# Patient Record
Sex: Female | Born: 1970 | Race: White | Smoking: Current every day smoker
Health system: Northeastern US, Academic
[De-identification: ages and names within clinical notes are randomized; demographics above are authoritative.]

## PROBLEM LIST (undated history)

## (undated) DIAGNOSIS — G709 Myoneural disorder, unspecified: Secondary | ICD-10-CM

## (undated) DIAGNOSIS — R5382 Chronic fatigue, unspecified: Secondary | ICD-10-CM

## (undated) DIAGNOSIS — F329 Major depressive disorder, single episode, unspecified: Secondary | ICD-10-CM

## (undated) DIAGNOSIS — K5190 Ulcerative colitis, unspecified, without complications: Secondary | ICD-10-CM

## (undated) DIAGNOSIS — G43909 Migraine, unspecified, not intractable, without status migrainosus: Secondary | ICD-10-CM

## (undated) DIAGNOSIS — R269 Unspecified abnormalities of gait and mobility: Secondary | ICD-10-CM

## (undated) DIAGNOSIS — G35 Multiple sclerosis: Secondary | ICD-10-CM

## (undated) DIAGNOSIS — G248 Other dystonia: Secondary | ICD-10-CM

## (undated) DIAGNOSIS — N200 Calculus of kidney: Secondary | ICD-10-CM

## (undated) DIAGNOSIS — M797 Fibromyalgia: Secondary | ICD-10-CM

## (undated) DIAGNOSIS — M1990 Unspecified osteoarthritis, unspecified site: Secondary | ICD-10-CM

## (undated) DIAGNOSIS — K219 Gastro-esophageal reflux disease without esophagitis: Secondary | ICD-10-CM

## (undated) DIAGNOSIS — F419 Anxiety disorder, unspecified: Secondary | ICD-10-CM

## (undated) DIAGNOSIS — F32A Depression, unspecified: Secondary | ICD-10-CM

## (undated) DIAGNOSIS — K5090 Crohn's disease, unspecified, without complications: Secondary | ICD-10-CM

## (undated) DIAGNOSIS — R519 Headache, unspecified: Secondary | ICD-10-CM

## (undated) HISTORY — DX: Depression, unspecified: F32.A

## (undated) HISTORY — DX: Migraine, unspecified, not intractable, without status migrainosus: G43.909

## (undated) HISTORY — DX: Fibromyalgia: M79.7

## (undated) HISTORY — DX: Chronic fatigue, unspecified: R53.82

## (undated) HISTORY — DX: Myoneural disorder, unspecified: G70.9

## (undated) HISTORY — PX: HERNIA REPAIR: SHX51

## (undated) HISTORY — DX: Anxiety disorder, unspecified: F41.9

## (undated) HISTORY — DX: Unspecified abnormalities of gait and mobility: R26.9

## (undated) HISTORY — DX: Major depressive disorder, single episode, unspecified: F32.9

## (undated) HISTORY — PX: CHOLECYSTECTOMY: SHX55

## (undated) HISTORY — DX: Unspecified osteoarthritis, unspecified site: M19.90

## (undated) HISTORY — DX: Other dystonia: G24.8

## (undated) HISTORY — DX: Multiple sclerosis: G35

## (undated) HISTORY — DX: Ulcerative colitis, unspecified, without complications: K51.90

## (undated) HISTORY — DX: Gastro-esophageal reflux disease without esophagitis: K21.9

## (undated) HISTORY — DX: Calculus of kidney: N20.0

## (undated) HISTORY — PX: ABDOMEN SURGERY: SHX537

## (undated) HISTORY — PX: OTHER SURGICAL HISTORY: SHX169

## (undated) HISTORY — PX: SUBTOTAL COLECTOMY: SHX855

## (undated) HISTORY — PX: BUNIONECTOMY: SHX129

## (undated) HISTORY — DX: Crohn's disease, unspecified, without complications: K50.90

## (undated) HISTORY — PX: COLONOSCOPY: SHX174

## (undated) HISTORY — PX: COLON SURGERY: SHX602

## (undated) HISTORY — PX: APPENDECTOMY: SHX54

## (undated) HISTORY — PX: BACK SURGERY: SHX140

## (undated) HISTORY — PX: UMBILICAL HERNIA REPAIR: SHX196

---
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
## 1991-12-24 HISTORY — PX: BACK SURGERY: SHX140

## 1995-12-24 HISTORY — PX: BUNIONECTOMY: SHX129

## 2005-08-15 ENCOUNTER — Other Ambulatory Visit: Payer: Self-pay | Admitting: Cardiology

## 2005-11-04 ENCOUNTER — Other Ambulatory Visit: Payer: Self-pay | Admitting: Cardiology

## 2005-12-23 HISTORY — PX: SUBTOTAL COLECTOMY: SHX855

## 2006-02-03 ENCOUNTER — Other Ambulatory Visit: Payer: Self-pay | Admitting: Cardiology

## 2006-05-01 ENCOUNTER — Other Ambulatory Visit: Payer: Self-pay | Admitting: Cardiology

## 2006-06-04 DIAGNOSIS — G35 Multiple sclerosis: Secondary | ICD-10-CM | POA: Insufficient documentation

## 2006-08-05 ENCOUNTER — Other Ambulatory Visit: Payer: Self-pay | Admitting: Cardiology

## 2006-11-19 ENCOUNTER — Other Ambulatory Visit: Payer: Self-pay | Admitting: Cardiology

## 2007-05-19 DIAGNOSIS — G35 Multiple sclerosis: Secondary | ICD-10-CM | POA: Insufficient documentation

## 2008-02-01 DIAGNOSIS — N200 Calculus of kidney: Secondary | ICD-10-CM | POA: Insufficient documentation

## 2008-02-01 DIAGNOSIS — K5190 Ulcerative colitis, unspecified, without complications: Secondary | ICD-10-CM | POA: Insufficient documentation

## 2008-02-01 DIAGNOSIS — K5090 Crohn's disease, unspecified, without complications: Secondary | ICD-10-CM | POA: Insufficient documentation

## 2008-02-01 DIAGNOSIS — F17200 Nicotine dependence, unspecified, uncomplicated: Secondary | ICD-10-CM | POA: Insufficient documentation

## 2008-02-01 DIAGNOSIS — G43909 Migraine, unspecified, not intractable, without status migrainosus: Secondary | ICD-10-CM | POA: Insufficient documentation

## 2008-02-08 DIAGNOSIS — IMO0001 Reserved for inherently not codable concepts without codable children: Secondary | ICD-10-CM | POA: Insufficient documentation

## 2008-02-08 DIAGNOSIS — G35 Multiple sclerosis: Secondary | ICD-10-CM | POA: Insufficient documentation

## 2008-10-10 ENCOUNTER — Encounter: Payer: Self-pay | Admitting: General Practice

## 2008-10-11 ENCOUNTER — Encounter: Payer: Self-pay | Admitting: General Practice

## 2010-12-23 HISTORY — PX: LUMBAR FUSION: SHX111

## 2011-06-12 ENCOUNTER — Other Ambulatory Visit: Payer: Self-pay | Admitting: Internal Medicine

## 2011-07-09 ENCOUNTER — Ambulatory Visit: Payer: Self-pay | Admitting: Nurse Practitioner

## 2011-07-09 ENCOUNTER — Encounter: Payer: Self-pay | Admitting: Nurse Practitioner

## 2011-07-09 VITALS — BP 133/85 | HR 93 | Ht 63.5 in | Wt 184.0 lb

## 2011-07-09 DIAGNOSIS — G35 Multiple sclerosis: Secondary | ICD-10-CM

## 2011-07-09 MED ORDER — MIRTAZAPINE 30 MG PO TABS *I*
30.0000 mg | ORAL_TABLET | Freq: Every evening | ORAL | Status: DC
Start: 2011-07-09 — End: 2012-02-17

## 2011-07-09 NOTE — Progress Notes (Signed)
PATIENT: Grace [name], Grace [name]  MRN: [id number]   DOB: 1971/12/11   DATE OF SERVICE: 07/09/2011          REASON FOR VISIT  Follow up six month appointment to discuss medical management and current symptoms.  HPI  Patient feels she is gradually getting worse and has not taken an DMT to treat her multiple sclerosis symptoms in several years. She is hesitant to resume Copaxone because of injection site reactions and lipoatrophy. She is hoping for a safe oral medication to be available soon. She feels her legs are weaker and she has more spasiticity along with short term memory issues and fatigue.  ALLERGIES  Allergies   Allergen Reactions   . Amoxicillin    . Codeine      Created by Conversion - 0;    . Hydromorphone      Created by Conversion - 0;    . Latex      Created by Conversion - 0;    . Prednisone    . Vicodin (Hydrocodone-Acetaminophen)         CURRENT MEDS:   Current Outpatient Prescriptions   Medication Sig   . mirtazapine (REMERON) 30 MG tablet Take 1 tablet (30 mg total) by mouth nightly     . sertraline (ZOLOFT) 100 MG tablet TAKE 1 TABLET 3 TIMES DAILY   . methylPREDNISolone sodium succinate (SOLU-MEDROL) 125 mg/ml injection INFUSE 1000mg  IN OF NORMAL SALINE EVERY DAYFOR 3 DAYS   . heparin 100 units/ml infusion Heparin 100 unit/ml PFSuse as directed   . sodium chloride 0.9% flush (NORMAL SALINE FLUSH) 0.9 % injection Normal Saline 3ml PFSUse as directed   . milnacipran (SAVELLA) 50 MG tablet take twice per day.   . sertraline (ZOLOFT) 100 MG tablet TAKE 2 TABLETS DAILY.        PERSONAL Hx:  Relapsing remitting multiple sclerosis with ongoing symptoms and further worsening of her short term memory, fatigue and function of her lower legs    ROS:  A 10 point review of systems was done including those positive findings included in the HPI. Patient reports ongoing symptoms and worsening of her short term memory, fatigue and function in her lower legs.    VITAL SIGNS:  Wt Readings from Last 3 Encounters:    07/09/11 83.462 kg (184 lb)   06/22/08 66.588 kg (146 lb 12.8 oz)   05/18/08 66.801 kg (147 lb 4.3 oz)     Temp Readings from Last 3 Encounters:   06/22/08 36.7 C (98.1 F)    05/18/08 36.7 C (98.1 F)    03/25/08 36.6 C (97.8 F)      BP Readings from Last 3 Encounters:   07/09/11 133/85   06/22/08 125/74   05/18/08 128/71     Pulse Readings from Last 3 Encounters:   07/09/11 93   06/22/08 99   05/18/08 98      Pain    07/09/11 1323   PainSc:   4        PHYSICAL EXAM:  On physical examination she was alert and oriented times three. There are choppy occular pursuits with normal facial movement and normal finger to nose ataxia. Upper strength is 5 out of 5. Lower strength is 4 out of 5 with dorsi flexion 4 out of 5. There is increase in tone in both legs and decrease in vibratory sensation in both feet. A timed 8 meter walk took 4:99 seconds.    ASSESSMENT:  Relapsing remitting multiple sclerosis  with ongoing symptoms but more short term memory issues, fatigue and weaker lower legs. I suggested she try physical therapy to strengthen her lower legs and to try to help with coordination. I also suggested she be scheduled for another MRI of her Brain and cervical spine. Patient will let us know when she can schedule an MRI working around her joint custody of her children and let our office times that would work for her. She is hoping that a safe oral medication to treat her MS will be available soon as she does not want to resume Copaxone. I made no other changes to her medications. She has a follow up appointment in three months. Thank you for allowing [place] to participate in the care of your patient.

## 2011-07-24 ENCOUNTER — Telehealth: Payer: Self-pay | Admitting: Nurse Practitioner

## 2011-07-24 NOTE — Telephone Encounter (Signed)
Pt called looking to set up her brain and cspine MRI since she had a severe panic attack on 06/25/11. She mentioned she has a Diazepam rx and would like these rescheduled. Since she wasn't able to complete the exams can you place new orders so I can schedule at either [facility] or [facility]? Thanks!

## 2011-07-29 ENCOUNTER — Other Ambulatory Visit: Payer: Self-pay | Admitting: Internal Medicine

## 2011-07-29 NOTE — Progress Notes (Signed)
Pt is scheduled @ [facility] for 3T brain MRI and cspine on 08/01/11 @ 12:45pm. Pt confirmed on 07/29/11. Cspine Berkley Harvey is [phone] EXP 08/01/11. Brain Berkley Harvey is [phone] EXP 09/12/11. Info also mailed on 07/29/11.

## 2011-08-05 ENCOUNTER — Telehealth: Payer: Self-pay | Admitting: Medical

## 2011-08-05 NOTE — Telephone Encounter (Signed)
 Please call with MRI results.

## 2011-08-05 NOTE — Telephone Encounter (Signed)
Spoke to pt, results given, scan shows activity.  Grace [name]

## 2011-08-06 NOTE — Telephone Encounter (Signed)
Pt was wondering if you can call her back @ [phone] to discuss the results again. She was driving last night and couldn't really talk. Please call to discuss. Thanks.

## 2011-08-06 NOTE — Telephone Encounter (Signed)
Called, no answer, left message.  Tom

## 2011-08-07 NOTE — Telephone Encounter (Signed)
The MRI scans have been completed.  Grace [name]

## 2011-08-07 NOTE — Telephone Encounter (Signed)
Spoke to pt again today, answered her questions.  Grace [name]

## 2011-08-07 NOTE — Telephone Encounter (Signed)
Please try pt at new number

## 2011-12-27 ENCOUNTER — Ambulatory Visit: Payer: Self-pay | Admitting: Nurse Practitioner

## 2011-12-27 ENCOUNTER — Encounter: Payer: Self-pay | Admitting: Nurse Practitioner

## 2011-12-27 MED ORDER — DIAZEPAM 5 MG PO TABS *I*
5.0000 mg | ORAL_TABLET | Freq: Two times a day (BID) | ORAL | Status: DC | PRN
Start: 2011-12-27 — End: 2013-04-06

## 2012-02-17 ENCOUNTER — Other Ambulatory Visit: Payer: Self-pay | Admitting: Nurse Practitioner

## 2012-02-17 MED ORDER — MIRTAZAPINE 30 MG PO TABS *I*
30.0000 mg | ORAL_TABLET | Freq: Every evening | ORAL | Status: DC
Start: 2012-02-17 — End: 2013-04-06

## 2013-04-06 ENCOUNTER — Emergency Department
Admission: EM | Admit: 2013-04-06 | Disposition: A | Discharge status: Home / Self Care | Payer: Self-pay | Source: Ambulatory Visit | Attending: Emergency Medicine | Admitting: Emergency Medicine

## 2013-04-06 ENCOUNTER — Other Ambulatory Visit: Payer: Self-pay | Admitting: Emergency Medicine

## 2013-04-06 ENCOUNTER — Encounter: Payer: Self-pay | Admitting: Emergency Medicine

## 2013-04-06 ENCOUNTER — Other Ambulatory Visit: Payer: Self-pay | Admitting: Gastroenterology

## 2013-04-06 LAB — BASIC METABOLIC PANEL
Anion Gap: 13 (ref 7–16)
Anion Gap: 10 (ref 7–16)
CO2: 17 mmol/L — ABNORMAL LOW (ref 20–28)
CO2: 25 mmol/L (ref 20–28)
Calcium: 9.3 mg/dL (ref 8.8–10.2)
Calcium: 8.7 mg/dL — ABNORMAL LOW (ref 8.8–10.2)
Chloride: 106 mmol/L (ref 96–108)
Chloride: 102 mmol/L (ref 96–108)
Creatinine: 0.89 mg/dL (ref 0.51–0.95)
Creatinine: 0.9 mg/dL (ref 0.51–0.95)
GFR,Black: 92 *
GFR,Black: 90 *
GFR,Caucasian: 80 *
GFR,Caucasian: 78 *
Glucose: 85 mg/dL (ref 60–99)
Glucose: 117 mg/dL — ABNORMAL HIGH (ref 60–99)
Lab: 10 mg/dL (ref 6–20)
Lab: 10 mg/dL (ref 6–20)
Potassium: 4.2 mmol/L (ref 3.3–5.1)
Potassium: 3.7 mmol/L (ref 3.3–5.1)
Sodium: 136 mmol/L (ref 133–145)
Sodium: 137 mmol/L (ref 133–145)

## 2013-04-06 LAB — RUQ PANEL (ED ONLY)
ALT: 18 U/L (ref 0–35)
ALT: 17 U/L (ref 0–35)
AST: 17 U/L (ref 0–35)
Albumin: 4.7 g/dL (ref 3.5–5.2)
Albumin: 4.2 g/dL (ref 3.5–5.2)
Alk Phos: 89 U/L (ref 35–105)
Alk Phos: 81 U/L (ref 35–105)
Amylase: 24 U/L — ABNORMAL LOW (ref 28–100)
Amylase: 24 U/L — ABNORMAL LOW (ref 28–100)
Bilirubin,Direct: <0.2 mg/dL (ref 0.0–0.3)
Bilirubin,Direct: <0.2 mg/dL (ref 0.0–0.3)
Bilirubin,Total: 0.8 mg/dL (ref 0.0–1.2)
Bilirubin,Total: 0.7 mg/dL (ref 0.0–1.2)
Lipase: 31 U/L (ref 13–60)
Lipase: 36 U/L (ref 13–60)
Total Protein: 7.3 g/dL (ref 6.3–7.7)
Total Protein: 6.6 g/dL (ref 6.3–7.7)

## 2013-04-06 LAB — CBC AND DIFFERENTIAL
Baso # K/uL: 0 10*3/uL (ref 0.0–0.1)
Basophil %: 0.2 % (ref 0.1–1.2)
Eos # K/uL: 0.4 10*3/uL (ref 0.0–0.4)
Eosinophil %: 3.6 % (ref 0.7–5.8)
Hematocrit: 45 % (ref 34–45)
Hemoglobin: 15.2 g/dL (ref 11.2–15.7)
Lymph # K/uL: 2.1 10*3/uL (ref 1.2–3.7)
Lymphocyte %: 20 % (ref 19.3–51.7)
MCV: 93 fL (ref 79–95)
Mono # K/uL: 0.6 10*3/uL (ref 0.2–0.9)
Monocyte %: 5.1 % (ref 4.7–12.5)
Neut # K/uL: 7.6 10*3/uL — ABNORMAL HIGH (ref 1.6–6.1)
Platelets: 269 10*3/uL (ref 160–370)
RBC: 4.9 MIL/uL (ref 3.9–5.2)
RDW: 12.7 % (ref 11.7–14.4)
Seg Neut %: 71.1 % (ref 34.0–71.1)
WBC: 10.7 10*3/uL — ABNORMAL HIGH (ref 4.0–10.0)

## 2013-04-06 LAB — PREGNANCY TEST, SERUM
Preg,Serum: NEGATIVE
Preg,Serum: NEGATIVE

## 2013-04-06 LAB — HOLD GREEN WITH GEL

## 2013-04-06 LAB — HOLD BLUE

## 2013-04-06 LAB — CRP: CRP: 7 mg/L (ref 0–10)

## 2013-04-06 LAB — HM HIV SCREENING OFFERED

## 2013-04-06 LAB — HEMOLYZED TEST

## 2013-04-06 LAB — SEDIMENTATION RATE, AUTOMATED: Sedimentation Rate: 5 mm/hr (ref 0–20)

## 2013-04-06 MED ORDER — IBUPROFEN 400 MG PO TABS *I*
800.0000 mg | ORAL_TABLET | Freq: Once | ORAL | Status: AC
Start: 2013-04-06 — End: 2013-04-06
  Administered 2013-04-06: 800 mg via ORAL
  Filled 2013-04-06: qty 2

## 2013-04-06 NOTE — ED Notes (Signed)
ED RN INTERN ATTESTATION       I Elise Benne, RN (RN) reviewed the following charting information by the RN intern:  Marily Memos    Nursing Assessments  Medications  Plan of Care  Teaching   Notes    In the chart of Grace [name] (42 y.o. female) and attest to the charting being accurate.

## 2013-04-06 NOTE — ED Notes (Signed)
Assumed care. Pt resting in bed. No new complaints. Will continue to monitor.

## 2013-04-06 NOTE — ED Provider Notes (Addendum)
History     Chief Complaint   Patient presents with   . Abdominal Pain   . Chest Pain     for few days     HPI Comments: 42 yo F h/o multiple sclerosis, Crohn's disease s/p subtotal colectomy here with 6 weeks progressively worsening pelvic pain, abnormal vaginal bleeding. Notes increased discomfort with her periods, heavier bleeding with her last 2 cycles, and now spotting every 2-3 days between cycles. Also notes increasing abdominal fullness, bladder pressure, and cramping in her left thigh. The last 2-3 days felt a "ripping" sensation when she would lie down on her left side, which was new and concerning, causing her to seek evaluation. No fevers, dysuria, changes in bowels. Supposed to get outpatient ultrasound through OB.GYN office but was not done due to insurance issues.    Also notes chest heaviness past year, with generalized fatigue, unchanged today.      History provided by:  Patient      Past Medical History   Diagnosis Date   . Multiple sclerosis    . Crohn's disease             Past Surgical History   Procedure Laterality Date   . Subtotal colectomy     . Umbilical hernia repair     . Cesarian         No family history on file.      Social History      reports that she has been passively smoking Cigarettes.  She has been smoking about 0.20 packs per day. She does not have any smokeless tobacco history on file. Her alcohol, drug, and sexual activity histories are not on file.    Living Situation    Questions Responses    Patient lives with     Homeless     Caregiver for other family member     External Services     Employment     Domestic Violence Risk           Review of Systems   Review of Systems   Constitutional: Negative for fever.   HENT: Negative for neck pain.    Eyes: Negative for pain.   Respiratory: Negative for shortness of breath.    Cardiovascular: Positive for chest pain.   Gastrointestinal: Positive for abdominal pain.   Genitourinary: Positive for vaginal bleeding and pelvic pain.  Negative for flank pain.   Musculoskeletal: Positive for back pain.   Skin: Negative for rash.   Neurological: Negative for headaches.       Physical Exam     ED Triage Vitals   BP Heart Rate Heart Rate(via Pulse Ox) Resp Temp Temp Source SpO2 O2 Device O2 Flow Rate   04/06/13 1038 04/06/13 1038 -- 04/06/13 1038 04/06/13 1038 04/06/13 1038 04/06/13 1038 04/06/13 1038 --   112/67 mmHg 77  16 36.6 C (97.9 F) TEMPORAL 100 % None (Room air)       Weight           04/06/13 1038           74.844 kg (165 lb)               Physical Exam   Nursing note and vitals reviewed.  Constitutional: She is oriented to person, place, and time. She appears well-developed. No distress.   Non-toxic   HENT:   Head: Normocephalic and atraumatic.   Eyes: EOM are normal.   Neck: Normal range of motion. Neck supple.  Cardiovascular: Normal rate and regular rhythm.    Pulmonary/Chest: Effort normal. No respiratory distress.   Abdominal: Soft. She exhibits no distension. There is tenderness (tender LLQ>suprapubically/RLQ to deep palpation, no rebound, guarding or percussion tederness).   Genitourinary:   Pelvic - No masses or no vaginal bleeding noted. Scant amount of whitish discharge. Some discomfort with cervical motion, minimal. No significant adnexal tenderness or fullness appreciated.    Musculoskeletal: Normal range of motion. She exhibits no edema.   Neurological: She is alert and oriented to person, place, and time.   Skin: Skin is warm and dry. No rash noted.   Psychiatric: She has a normal mood and affect.       Medical Decision Making      Amount and/or Complexity of Data Reviewed  Clinical lab tests: ordered and reviewed  Tests in the radiology section of CPT: ordered and reviewed  Tests in the medicine section of CPT: ordered and reviewed        Initial Evaluation:  ED First Provider Contact    Date/Time Event User Comments    04/06/13 1034 ED Provider First Contact [place], [facility] Initial Face to Face Provider Contact           Patient seen by me on arrival date of 04/06/2013 at 1318    Assessment:  42 y.o., female comes to the ED with 6 weeks progressive pelvic pain, fullness and irregular vaginal bleeding, overall non-toxic with some abdominal tenderness but no peritoneal signs. HD normal. Pelvic unremarkable. Patient mentioned to initial provider that she was experiencing chest heaviness, but on further history, this has been ongoing and unchanged.     Differential Diagnosis includes pelvic mass (fibroid, endometrial/ovarian malignancy), uti/pyelo, exam not suggestive of PID, r/o pregnancy, unlikely crohn's flare, appy, dissection             Plan:   IV, tele, 02 prn  Labs: cbc, ed7, ruq, pelvic cultures, ua, ucx, upreg  Imaging: cxr, ecg, CT abd/pelvis, pelvic US  Treatment: ivf, anti-emetics and pain meds prn     Dispo depends on clinical course, symptom control, and workup. Consult as appropriate, anticipate home if all benign.          Gaspar Cola, MD    Gaspar Cola, MD  Resident  04/06/13 [phone]    Resident Attestation:     Patient seen by me on arrival date of 04/06/2013 at 1430    History:   I reviewed this patient, reviewed the resident's note and agree.  Exam:   I examined this patient, reviewed the resident's note and agree.    Decision Making:   I discussed with the resident his/her documented decision making  and agree.      Author Loa Socks, MD      Loa Socks, MD  04/07/13 [phone]

## 2013-04-06 NOTE — ED Notes (Signed)
Abdominal pain lower abdomen. History of cysts.. Also complains of chest pressure. No fever, chills.

## 2013-04-06 NOTE — First Provider Contact (Signed)
ED Medical Screening Exam Note    Initial provider evaluation performed by   ED First Provider Contact    Date/Time Event User Comments    04/06/13 1034 ED Provider First Contact Maeli Spacek, [facility] Initial Face to Face Provider Contact        Patient with PMH multiple sclerosis presents with several days of abdominal pain, chest heaviness, nausea.    NAD in triage. VSS.    EKG and LABS and CXR ordered.    Patient requires further evaluation.     Vonzell Schlatter Riggins, [place], 04/06/2013, 10:34 AM

## 2013-04-06 NOTE — ED Notes (Signed)
Pt asking about imaging result and was informed that the CT results has not yet returned. Pt complaining of a 6/10 pain and requesting pain medication, provider notified. Sister at beside.

## 2013-04-06 NOTE — ED Notes (Signed)
Pt to CT

## 2013-04-06 NOTE — ED Notes (Signed)
A and O x's 4.  VSS.  Pt here with lower abd pain,  Feels like cramping. H/O cysts.  LMP was 2 weeks ago,  Spotting since.  Will continue to monitor per MD orders.

## 2013-04-08 LAB — EKG 12-LEAD
P: 49 degrees
QRS: 91 degrees
Rate: 70 {beats}/min
Statement: BORDERLINE
T: 46 degrees

## 2015-02-27 ENCOUNTER — Ambulatory Visit
Admit: 2015-02-27 | Discharge: 2015-02-27 | Disposition: A | Discharge status: Home / Self Care | Payer: Self-pay | Attending: Emergency Medicine | Admitting: Emergency Medicine

## 2015-02-27 ENCOUNTER — Other Ambulatory Visit: Payer: Self-pay | Admitting: Gastroenterology

## 2015-02-27 DIAGNOSIS — R0789 Other chest pain: Secondary | ICD-10-CM

## 2015-02-27 HISTORY — DX: Fibromyalgia: M79.7

## 2015-02-27 LAB — HM HIV SCREENING OFFERED

## 2015-02-27 NOTE — ED Notes (Signed)
chest pain x one week - worse while lying down - pain substernal - left shoulder and back - pain comes in "shocks" and sometimes a flutter feeling

## 2015-02-27 NOTE — Discharge Instructions (Signed)
The most likely cause of your symptoms is probably related to your esophagus as it was 7 years ago.  For that reason, increase the Prilosec to 2 pills (40 mg) daily and a referral was placed to gastroenterologist.    The symptoms are atypical for cardiac disease so you're not sent to the hospital at this time.  Nevertheless a cardiology referral was placed to make sure there is no underlying coronary artery disease.    You should be called in the next 2 days to schedule both of these referrals.    You understand that if you develop worsening chest pain or pressure, fainting, severe shortness of breath or any other worrisome symptoms you should go to emergency room immediately.        If you do not have a primary care doctor, the following physicians are currently accepting new patients:    [facility]  [address]  [phone]  Mayer Camel, MD (FM)  Lyman Speller. Gift, DO (FM)  Rachel Bo, MD (FM)    [facility]  [address]  [phone]  Wyline Mood, MD, PhD (FM)  Esmond Plants, MD [facility] [place])  Accepting Pediatric and Obstetric Patients Only  Carole Civil, MD ([place], [place])    [facility]  [address]  [phone]  Fulton Mole, DO ([place])  Accepting Pediatric Patients Only    [facility]  [address]  [phone]  Alfredia Ferguson, MD (Peds)  Erick Blinks, MD (Peds)     [facility]  [address]  [phone]  Nydia Bouton, MD (MP)  Accepting Pediatric Patients Only                Area    [facility]  [address]  [phone]  Margreta Journey, MD (MP)  Accepting Pediatric Patients Only    [facility]  [address]  [phone]  Accepting Pediatric Patients Only    [facility]  [address] [place]  [phone]  Call for a list of accepting physicians    NEW PRACTICE   [facility]  [address]  [phone]  Dondra Spry, MD (FM)  Lavonne Chick, MD, PhD (FM)  Sindy Guadeloupe, MD (FM)  Jeanmarie Plant, MD [facility])    [facility]  [address]  [phone]  Kenton Kingfisher, MD (FM)  Carmin Richmond, MD (FM)  Dolphus Jenny, MD (FM)  Courtney Heys, DO (FM)    [facility]  [address] [facility]  [facility] Care Facility - 5th floor  [phone]  Call for a list of accepting physicians    [facility]  [address]  [phone]  Valeta Harms, MD (IM/Peds)  Accepting Pediatric Patients Only   [facility]  [place]  [address]  [phone]  Alfonzo Feller, MD (FM)    [facility]  [place]  [address]  [phone]  Alfonzo Feller, MD (FM)    [facility]   [address]   [phone]   Yolanda Manges, MD (IM)   Trula Ore  Gwyndolyn Saxon, MD (IM)     [address]  [phone]  Johny Drilling, MD (FM/OB)  Jennelle Human, MD (FM/OB)  Accepting Pediatric and Obstetric  Patients Only    [facility]  [address]  [phone]  Cindie Crumbly, MD (IM)    [facility]  [address]  [phone]  Rosetta Posner, MD ([place])  Norvel Richards, DO ([place])  Royanne Foots, DO ([place])    [facility]  [address]  [phone]  Didem Miraloglu, MD (IM)     FM: Family Medicine   IM: Internal Medicine  IM/Peds: Internal Medicine & Pediatrics  MP: Family Medicine & Pediatrics  OB Obstetrics  Peds: Pediatrics   For physician referral services, please call [phone] visit [email or web address]

## 2015-02-27 NOTE — UC Provider Note (Signed)
History     Chief Complaint   Patient presents with    Chest Pain     chest pain x one week - worse while lying down - pain substernal - left shoulder and back - pain comes in "shocks" and sometimes a flutter feeling     HPI Comments: 44 year old woman with medical history of multiple sclerosis, Crohn's disease, fibromyalgia, GERD and smoking presents to the urgent care Center with one week of substernal chest pain that is constant and radiates to her left shoulder, left upper back area.  She reports she does feel some dyspnea which is not associated with exertion.  She also feels that there is something stuck in her throat/esophagus.  This feeling and the chest pressure worsens when she eats.  She also reports intermittent "fluttery" sensation in her chest that is worse with meals. She denies nausea or vomiting.  She denies lightheadedness.  She does say that she has diaphoresis at night.  She says that her current symptoms are similar to being hospitalized 7 years ago for esophageal spasms.      She has no history of known coronary artery disease.  She has no known diagnosis of hypertension, hyperlipidemia or diabetes but has not seen a PCP in 2 years for insurance reasons.      Family history: Uncle with heart attack in his 76s.  Grandmother had congestive heart failure.  No coronary artery disease in her parents    The patient does not usually take anti-inflammatory medications because of her Crohn's.  She has taken Motrin a few times in the past week and is not sure if it has helped her symptoms.      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  She has been smoking about 0.25 packs per day. She does not have any smokeless tobacco history on file. Her alcohol, drug, and sexual activity histories are not on file.    Living Situation     Questions Responses    Patient lives with Significant Other    Homeless No    Caregiver for other family member     External Services None    Employment Disabled    Domestic Violence Risk No          Review of Systems   Review of Systems   Constitutional: Positive for diaphoresis. Negative for fever.   HENT: Negative for congestion and sore throat.    Eyes: Negative for visual disturbance.   Respiratory: Positive for chest tightness and shortness  of breath. Negative for cough.    Cardiovascular: Positive for palpitations.   Gastrointestinal: Negative for vomiting and abdominal pain.   Skin: Negative for rash.   Neurological: Negative for light-headedness and headaches.       Physical Exam     ED Triage Vitals   BP Heart Rate Heart Rate (via Pulse Ox) Resp Temp Temp src SpO2 O2 Device O2 Flow Rate   02/27/15 1044 02/27/15 1044 -- 02/27/15 1044 02/27/15 1044 02/27/15 1044 02/27/15 1044 -- --   155/89 mmHg 79  18 37.3 C (99.1 F) TEMPORAL 100 %        Weight           02/27/15 1044           79.379 kg (175 lb)               Physical Exam   Constitutional: She is oriented to person, place, and time. She appears well-developed and well-nourished. No distress.   HENT:   Head: Normocephalic.   Eyes: Conjunctivae are normal. Pupils are equal, round, and reactive to light.   Neck: Normal range of motion. Neck supple.   Cardiovascular: Normal rate, regular rhythm and normal heart sounds.    No murmur heard.  Pulmonary/Chest: Effort normal and breath sounds normal. No respiratory distress. She has no  wheezes.   Abdominal: Soft. Bowel sounds are normal. There is no tenderness.   Musculoskeletal: She exhibits no edema.   She reports that pressing on her sternal area does somewhat worsen the pressure in this area.   Neurological: She is alert and oriented to person, place, and time.   Skin: No rash noted. She is not diaphoretic.   Nursing note and vitals reviewed.      Medical Decision Making      Amount and/or Complexity of Data Reviewed  Tests in the medicine section of CPT: ordered and reviewed        Initial Evaluation:  ED First Provider Contact     Date/Time Event User Comments    02/27/15 1042 ED Provider First Contact Lianne Moris Initial Face to Face Provider Contact          Patient seen by me as above    Assessment:  44 y.o., female with medical history of multiple sclerosis, Crohn's disease, fibromyalgia, GERD and smoking comes to the Urgent Care Center with one week of constant substernal pressure, a feeling of something being stuck in her esophagus, and intermittent fluttering in her chest all of which are worse with meals.  The pressure radiates to her left shoulder and left upper back. physical exam reveals an afebrile woman in no acute distress with heart rate 79, respiratory rate 18, blood pressure 155/89, and oxygen saturation 100% on room air.    Differential Diagnosis includes esophageal spasm, esophageal stricture, GERD, peptic ulcer disease, costochondritis, pericarditis, myocarditis, pleuritis, anxiety.  Her symptoms are atypical for coronary artery disease or pulmonary embolism.        EKG shows sinus rhythm with a heart rate of 72.  No ST elevations or depressions.             Plan:   PE, EKG  DC'd with diagnosis of atypical chest pressure  Increase Prilosec to 40 mg a day  Referral placed to cardiology and gastroenterology  Discussed with patient importance of going to emergency department if symptoms worsen before she sees these doctors and she voiced understanding.  List of local  primary care  providers was given    Konrad Penta, MD

## 2015-03-01 LAB — EKG 12-LEAD
P: 43 degrees
QRS: 93 degrees
Rate: 72 {beats}/min
Statement: BORDERLINE
T: 36 degrees

## 2015-03-03 ENCOUNTER — Ambulatory Visit
Admit: 2015-03-03 | Discharge: 2015-03-03 | Disposition: A | Discharge status: Home / Self Care | Payer: Self-pay | Source: Ambulatory Visit | Attending: Cardiology | Admitting: Cardiology

## 2015-03-03 ENCOUNTER — Ambulatory Visit: Payer: Self-pay | Admitting: Cardiology

## 2015-03-03 ENCOUNTER — Encounter: Payer: Self-pay | Admitting: Cardiology

## 2015-03-03 VITALS — BP 130/63 | HR 79 | Ht 64.0 in | Wt 174.0 lb

## 2015-03-03 DIAGNOSIS — R079 Chest pain, unspecified: Secondary | ICD-10-CM

## 2015-03-03 DIAGNOSIS — R61 Generalized hyperhidrosis: Secondary | ICD-10-CM

## 2015-03-03 DIAGNOSIS — R002 Palpitations: Secondary | ICD-10-CM

## 2015-03-03 NOTE — Discharge Instructions (Signed)
This recording of your ECG is a common procedure that requires your cooperation for the best possible results.   Write any symptoms that you feel as you go about your normal daily activities.  For each event, please note any activity you were doing at that time (see examples).     Symptoms May Include:   Chest, arm or neck pain.  Shortness of breath or difficulty breathing.  Skipped beats, palpitations or pounding heart beats.  Dizziness, lightheadedness, nausea or faintness.   Any symptom that caused you to see your doctor.     Activities May Include:   Going to bed and waking up.  Start and stop of exercise (walking, running, cycling).  Time of medication.    IMPORTANT!    If your symptom is severe, seek medical attention.   The recorder cannot help you and will not contact medical personnel.    Keep the recorder dry! Sponge bath if necessary.    Avoid electric blankets and industrial machinery.     "Sample Diary"    Date and Time of Event    Symptom/Activity   9:30am    Dizziness/ Walked to car   11:12am    Chest Pain/ Watching TV            How to remove the monitor:  · Remove the recorder after the appropriate amount of time (24 or 48 hours) by peeling off the adhesive electrodes.  Discard the electrodes.  You do not have to turn off the monitor.    · Place the wires, recorder, carrying pouch and diary in the plastic bag and then in the padded envelope with the green label.    · Please return the recorder as soon as possible after the removal.    Where to return the monitor:    Sunday to Saturday 7:00 am to 7:00 pm  Please return the monitor to Patient Discharge.  Parking staff may / or may not be able to take the monitor, depending on their availability.  The attendant may take the monitor or, if not, you may be directed to a parking spot.  If the attendant is not available, please bring the padded envelope into the monitor drop box across from the [employer], adjacent to Patient Discharge (just past the  parking ticket machine) and place it in the designated slot.    After Hours: 7:00 pm to 7:00 am  Please return the monitor to the Information Desk in the Main Lobby of [facility].  Drive into the traffic circle in the front of the hospital ([address] side) and pull to the far right.  Put the flashers on the car and bring the monitor into the Information Desk and hand it to someone behind the desk.

## 2015-03-03 NOTE — Progress Notes (Signed)
Dr. Jesusita Oka   Assistant Professor, Division of Cardiology  [facility] of [facility]  Cardiology, [address]  [address]  [place], [place] [address]  Office: [phone]   Fax: [phone]    03/03/2015    RE: [name]    Dear Dr. Provider, None:    I had the pleasure of seeing your patient [name] in the Cardiology Clinic at  the [facility] Cardiology Outpatient Office on 03/03/2015. Below is a summary of our visit for your reference:    History of Present Illness:  As you know [name] is a 44 y.o. female with significant past medical history of   1. Multiple sclerosis  2. Crohn disease    The patient has been noticing chest pain since November of last year. Her first episode occurred while she was sleeping and she awoke with the pain. She described this as a pressure like sensation 6/10 radiating to the neck and left arm. She occasionally notices it on the back radiating to the axilla.  She reports associated palpitations and diaphoresis.  She has been noticing over the last few week DOE with occasional chest tightness.  She has not had rest pain since the first episode in November.     Past Medical History:  1. Multiple sclerosis diagnosed in 1999  2. Crohn's Disease s/p subtotal colectomy: since high school  3. Nephrolithiasis  4. Migraine Headaches since childhood  5. Nicotine dependance    Past Surgical History:  1. Subtotal colectomy  2. C-section x 2  3. Back spine lumbar fusion  4. Ear tube surgery  5. Foot surgeries      ALLERGIES:    Allergies   Allergen Reactions    Latex      Created by Conversion - 0;     Amoxicillin     Codeine      Created by Conversion - 0;     Hydromorphone      Created by Conversion - 0;     Morphine Nausea And Vomiting     shakiness    Prednisone     Vicodin [Hydrocodone-Acetaminophen]      SOCIAL HISTORY:  +smoke 6 cigs/day  Social ETOH  Marijuana  Divorced: x 2009  2 children (16, 12)    FAMILY  HISTORY:  Family History   Problem Relation Age of Onset    Cancer Mother     Cancer Father     Bleeding prob Sister     Alcohol abuse Brother    Father died at age 32: lung cancer  Mother died at age 14: breast cancer    Review of Systems    Medications  Current Outpatient Prescriptions   Medication Sig    Ibuprofen-Diphenhydramine HCl 200-25 MG CAPS Take by mouth daily as needed    omeprazole (PRILOSEC) 20 MG capsule Take 20 mg by mouth daily (before breakfast)    loratadine (CLARITIN) 10 MG tablet Take 10 mg by mouth daily     No current facility-administered medications for this visit.         Physical Examination  Filed Vitals:    03/03/15 1550   BP: 130/63   Pulse: 79   Height: 1.626 m (5\' 4" )   Weight: 78.926 kg (174 lb)    Body mass index is 29.85 kg/(m^2). Body surface area is 1.89 meters squared.  Wt Readings from Last 4 Encounters:   03/03/15 78.926 kg (174 lb)  02/27/15 79.379 kg (175 lb)   04/06/13 74.844 kg (165 lb)   07/09/11 83.462 kg (184 lb)       Gen: NAD alert and oriented  HEENT:  Sclerae anicteric.  Neck is supple with no lymphadenopathy or thyromegaly.  JVP is not elevated.   Cardiovascular: Normal S1-S2 no S3 no S4 without murmurs or rubs.  Respiratory: Normal vesicular breath sounds.  Clear to auscultation bilaterally without rhonchi, rales or wheezes.  GI: Bowel sounds are present.  There is no hepatosplenomegaly, bruits or masses.  Extremities: 2+ dorsalis pedis pulse.  2+ posterior tibial pulse.  No clubbing, cyanosis or edema.    Laboratory/Studies:    BMP/Electrolytes CBC       Component Value Date/Time    NA 137 04/06/2013 1817    K 3.7 04/06/2013 1817    CL 102 04/06/2013 1817    CO2 25 04/06/2013 1817    UN 10 04/06/2013 1817    CREAT 0.90 04/06/2013 1817    CREAT 0.89 04/06/2013 1118    GLU 117* 04/06/2013 1817   ]     Component Value Date/Time    WBC 10.7* 04/06/2013 1118    HGB 15.2 04/06/2013 1118    HCT 45 04/06/2013 1118    PLT 269 04/06/2013 1118   ]   Liver Function  Lipids       Component Value Date/Time    AST 17 04/06/2013 1817    ALT 17 04/06/2013 1817    TB 0.7 04/06/2013 1817    DB <0.2 04/06/2013 1817    No results found for: CHOL, LDLC, HDL, TRIG]       (CKTS:3,TROPU:3,TROP:3,RICKMBS:3,MCKMB:3,CKMB:3)@       X-rays the past 24 hours:  No results found.       There is no immunization history on file for this patient.    ECG: NSR without acute ST/T changes    Assessment:   44 y.o. female with significant past medical history of   1. Multiple sclerosis  2. Crohn disease  3. Chest pain concerning for possible ischemia  4. Nicotine dependence    Plan:  1. Patient was counseled on absolute need for smoking cessation  2. We will go ahead and schedule a stress echo to evaluate for ischemia  3. Check CXR PA and lateral  4. 24 hr Holter monitor  5. Follow up in 1 month for further management and evaluation  6. Patient was counseled to seek immediate medical attention including going to the ED if she has recurrent chest pain symptoms.      Thank you very much for allowing me to participate in [name] care. If you have any further questions or concerns, please contact me at your earliest convenience.      Sincerely,        Jesusita Oka MD, M.Sc., [facility], FRCPC,  ST:er    03/03/2015 4:14 PM     DICTATED

## 2015-03-07 ENCOUNTER — Telehealth: Payer: Self-pay | Admitting: Cardiology

## 2015-03-07 NOTE — Telephone Encounter (Addendum)
Dr. Maisie Fus please place a referral for pt to be seen in Neurology for Multiple Sclerosis. Unable to get in, Neuro looking for a referral from [place].      Pt also looking for chest xray results from yesterday.

## 2015-03-14 ENCOUNTER — Ambulatory Visit
Admit: 2015-03-14 | Discharge: 2015-03-14 | Disposition: A | Discharge status: Home / Self Care | Payer: Self-pay | Source: Ambulatory Visit | Admitting: Cardiology

## 2015-03-14 ENCOUNTER — Ambulatory Visit
Admit: 2015-03-14 | Discharge: 2015-03-14 | Disposition: A | Discharge status: Home / Self Care | Payer: Self-pay | Source: Ambulatory Visit | Attending: Cardiology | Admitting: Cardiology

## 2015-03-14 DIAGNOSIS — R61 Generalized hyperhidrosis: Secondary | ICD-10-CM

## 2015-03-14 DIAGNOSIS — R079 Chest pain, unspecified: Secondary | ICD-10-CM

## 2015-03-14 LAB — EXERCISE STRESS ECHO COMPLETE
Aortic Arch Diameter: 2.31 cm
Aortic Diameter (mid tubular): 2.63 cm
Aortic Diameter (sinus of Valsalva): 2.73 cm
BMI: 29.9 kg/m2
BP Diastolic: 60 mmHg
BP Systolic: 124 mmHg
BSA: 1.89 m2
Deceleration Time - MV: 215.58 ms
E/A ratio: 1.67
ECG PR interval: 140 ms
ECG QRS interval: 80 ms
Echo RV Stroke Work Index Estimate: 756.5 mmHg•mL/m2
Estimated workload: 9.9 METS
Heart Rate: 80 {beats}/min
Height: 64 in
LA Diameter BSA Index: 1.7 cm/m2
LA Diameter Height Index: 2 cm/m
LA Diameter: 3.24 cm
LV ASE Mass BSA Index: 59.7 gm/m2
LV ASE Mass Height 2.7 Index: 30.4 gm/m2.7
LV ASE Mass Height Index: 69.4 gm/m
LV ASE Mass: 112.8 gm
LV CO BSA Index: 3.19 L/min/m2
LV Cardiac Output: 6.02 L/min
LV Diastolic Volume Index: 55.1 mL/m2
LV Posterior Wall Thickness: 0.81 cm
LV SV - LVOT SV Diff: 3.94 mL
LV SV BSA Index: 39.8 mL/m2
LV SV Height Index: 46.3 mL/m
LV Septal Thickness: 0.86 cm
LV Stroke Volume: 75.3 mL
LV Systolic Volume Index: 15.2 mL/m2
LVED Diameter BSA Index: 2.3 cm/m2
LVED Diameter Height Index: 2.7 cm/m
LVED Diameter: 4.33 cm
LVED Volume BSA Index: 55 ml/m2
LVED Volume BSA Index: 55.1 mL/m2
LVED Volume Height Index: 64 mL/m
LVED Volume: 104.05 mL
LVEF (Volume): 72 %
LVES Diameter BSA Index: 1 cm/m2
LVES Diameter Height Index: 1.2 cm/m
LVES Diameter: 1.88 cm
LVES Volume BSA Index: 15 ml/m2
LVES Volume BSA Index: 15.2 mL/m2
LVES Volume Height Index: 17.7 mL/m
LVES Volume: 28.8 mL
LVOT Area (calculated): 3.46 cm2
LVOT Cardiac Index: 3.02 L/min/m2
LVOT Cardiac Output: 5.71 L/min
LVOT Diameter: 2.1 cm
LVOT PWD VTI: 20.6 cm
LVOT PWD Velocity (mean): 62.02 cm/s
LVOT PWD Velocity (peak): 101.26 cm/s
LVOT SV BSA Index: 37.73 mL/m2
LVOT SV Height Index: 43.9 mL/m
LVOT Stroke Rate (mean): 214.7 mL/s
LVOT Stroke Rate (peak): 350.5 mL/s
LVOT Stroke Volume: 71.31 cc
MPHR: 148.05 {beats}/min
MR Regurgitant Fraction (LV SV Mtd): 0.05
MR Regurgitant Volume (LV SV Mtd): 3.9 mL
MV Peak A Velocity: 35.87 cm/s
MV Peak E Velocity: 60.02 cm/s
Mitral Annular E/Ea Vel Ratio: 6.55
Mitral Annular Ea Velocity: 9.16 cm/s
Peak DBP: 80 mmHg
Peak Gradient - TR: 19 mmHg
Peak HR: 148 {beats}/min
Peak SBP: 168 mmHg
Peak Velocity - TR: 218.13 cm/s
Percent MPHR: 84.6 %
Pulmonary Vascular Resistance Estimate: 3.2 mmHg
RA Pressure Estimate: 6 mmHg
RPP: 24864 BPM x mmHG
RR Interval: 750 ms
RV Peak Systolic Pressure: 25 mmHg
Stress Peak Stage: 3.27
Stress duration (min): 9 min
Stress duration (sec): 49 s
Weight: 2784 oz

## 2015-03-14 MED ORDER — SODIUM CHLORIDE 0.9 % IV SOLN WRAPPED *I*
10.0000 mL | Status: AC | PRN
Start: 2015-03-14 — End: 2015-03-14

## 2015-03-14 MED ORDER — PERFLUTREN PROTEIN A MICROSPH (OPTISON) IV SUSP *I*
1.5000 mL | INTRAVENOUS | Status: DC | PRN
Start: 2015-03-14 — End: 2015-03-15

## 2015-03-14 NOTE — Discharge Instructions (Signed)
Instructed patient to resume normal daily activities and home medications.  Obtain results from ordering provider.

## 2015-03-16 ENCOUNTER — Emergency Department
Admit: 2015-03-16 | Discharge: 2015-03-16 | Disposition: A | Discharge status: Home / Self Care | Payer: Self-pay | Source: Ambulatory Visit | Attending: Emergency Medicine | Admitting: Emergency Medicine

## 2015-03-16 DIAGNOSIS — R519 Headache, unspecified: Secondary | ICD-10-CM

## 2015-03-16 DIAGNOSIS — J0120 Acute ethmoidal sinusitis, unspecified: Secondary | ICD-10-CM

## 2015-03-16 HISTORY — DX: Headache, unspecified: R51.9

## 2015-03-16 LAB — CBC AND DIFFERENTIAL
Baso # K/uL: 0 10*3/uL (ref 0.0–0.1)
Basophil %: 0.1 %
Eos # K/uL: 0.1 10*3/uL (ref 0.0–0.4)
Eosinophil %: 1.4 %
Hematocrit: 42 % (ref 34–45)
Hemoglobin: 14.3 g/dL (ref 11.2–15.7)
Lymph # K/uL: 0.9 10*3/uL — ABNORMAL LOW (ref 1.2–3.7)
Lymphocyte %: 10.1 %
MCH: 32 pg/cell (ref 26–32)
MCHC: 34 g/dL (ref 32–36)
MCV: 95 fL (ref 79–95)
Mono # K/uL: 0.7 10*3/uL (ref 0.2–0.9)
Monocyte %: 8 %
Neut # K/uL: 7.2 10*3/uL — ABNORMAL HIGH (ref 1.6–6.1)
Platelets: 248 10*3/uL (ref 160–370)
RBC: 4.4 MIL/uL (ref 3.9–5.2)
RDW: 12.6 % (ref 11.7–14.4)
Seg Neut %: 80.4 %
WBC: 9 10*3/uL (ref 4.0–10.0)

## 2015-03-16 LAB — BASIC METABOLIC PANEL
Anion Gap: 11 (ref 7–16)
CO2: 23 mmol/L (ref 20–28)
Calcium: 9.7 mg/dL (ref 8.6–10.2)
Chloride: 102 mmol/L (ref 96–108)
Creatinine: 0.74 mg/dL (ref 0.51–0.95)
GFR,Black: 113 *
GFR,Caucasian: 98 *
Glucose: 101 mg/dL — ABNORMAL HIGH (ref 60–99)
Lab: 6 mg/dL (ref 6–20)
Potassium: 3.9 mmol/L (ref 3.3–5.1)
Sodium: 136 mmol/L (ref 133–145)

## 2015-03-16 LAB — MAGNESIUM: Magnesium: 1.8 mEq/L (ref 1.3–2.1)

## 2015-03-16 MED ORDER — MAGNESIUM SULFATE 2 GM/50ML IN SW IV SOLN *I*
2000.0000 mg | Freq: Once | INTRAVENOUS | Status: AC
Start: 2015-03-16 — End: 2015-03-16
  Administered 2015-03-16: 2000 mg via INTRAVENOUS

## 2015-03-16 MED ORDER — METOCLOPRAMIDE HCL 5 MG/ML IJ SOLN *I*
10.0000 mg | Freq: Once | INTRAMUSCULAR | Status: AC
Start: 2015-03-16 — End: 2015-03-16
  Administered 2015-03-16: 10 mg via INTRAVENOUS
  Filled 2015-03-16: qty 2

## 2015-03-16 MED ORDER — SODIUM CHLORIDE 0.9 % IV BOLUS *I*
1000.0000 mL | Freq: Once | Status: AC
Start: 2015-03-16 — End: 2015-03-16
  Administered 2015-03-16: 1000 mL via INTRAVENOUS

## 2015-03-16 MED ORDER — DOXYCYCLINE HYCLATE 100 MG PO CAPS *I*
100.0000 mg | ORAL_CAPSULE | Freq: Two times a day (BID) | ORAL | Status: DC
Start: 2015-03-16 — End: 2015-10-09

## 2015-03-16 MED ORDER — MAGNESIUM SULFATE 50 % IJ SOLN *I*
1000.0000 mg | Freq: Once | INTRAMUSCULAR | Status: DC
Start: 2015-03-16 — End: 2015-03-16
  Filled 2015-03-16: qty 2

## 2015-03-16 MED ORDER — MAGNESIUM SULFATE 2 GM/50ML IN SW IV SOLN *I*
1000.0000 mg | Freq: Once | INTRAVENOUS | Status: DC
Start: 2015-03-16 — End: 2015-03-16
  Filled 2015-03-16: qty 50

## 2015-03-16 MED ORDER — FENTANYL CITRATE 50 MCG/ML IJ SOLN *WRAPPED*
50.0000 ug | Freq: Once | INTRAMUSCULAR | Status: AC
Start: 2015-03-16 — End: 2015-03-16
  Administered 2015-03-16: 50 ug via INTRAVENOUS
  Filled 2015-03-16: qty 2

## 2015-03-16 MED ORDER — METOCLOPRAMIDE HCL 10 MG PO TABS *I*
10.0000 mg | ORAL_TABLET | Freq: Three times a day (TID) | ORAL | Status: DC | PRN
Start: 2015-03-16 — End: 2015-10-09

## 2015-03-16 MED ORDER — OXYCODONE HCL 5 MG PO CAPS *A*
5.0000 mg | ORAL_CAPSULE | Freq: Four times a day (QID) | ORAL | Status: DC | PRN
Start: 2015-03-16 — End: 2015-10-09

## 2015-03-16 NOTE — ED Provider Progress Notes (Addendum)
ED Provider Progress Note    Pt back CT  - pt requesting po ice chips  If will tolerate, will try caffeine  HA down following Fentanyl and REglan  Continue to monitor       Costella Hatcher, MD, 03/16/2015, 8:26 AM      Recent Results (from the past 24 hour(s))   CBC and differential    Collection Time: 03/16/15  7:44 AM   Result Value Ref Range    WBC 9.0 4.0 - 10.0 THOU/uL    RBC 4.4 3.9 - 5.2 MIL/uL    Hemoglobin 14.3 11.2 - 15.7 g/dL    Hematocrit 42 34 - 45 %    MCV 95 79 - 95 fL    MCH 32 26 - 32 pg/cell    MCHC 34 32 - 36 g/dL    RDW 14.4 81.8 - 56.3 %    Platelets 248 160 - 370 THOU/uL    Seg Neut % 80.4 %    Lymphocyte % 10.1 %    Monocyte % 8.0 %    Eosinophil % 1.4 %    Basophil % 0.1 %    Neut # K/uL 7.2 (H) 1.6 - 6.1 THOU/uL    Lymph # K/uL 0.9 (L) 1.2 - 3.7 THOU/uL    Mono # K/uL 0.7 0.2 - 0.9 THOU/uL    Eos # K/uL 0.1 0.0 - 0.4 THOU/uL    Baso # K/uL 0.0 0.0 - 0.1 THOU/uL   Basic metabolic panel    Collection Time: 03/16/15  7:44 AM   Result Value Ref Range    Glucose 101 (H) 60 - 99 mg/dL    Sodium 149 702 - 637 mmol/L    Potassium 3.9 3.3 - 5.1 mmol/L    Chloride 102 96 - 108 mmol/L    CO2 23 20 - 28 mmol/L    Anion Gap 11 7 - 16    UN 6 6 - 20 mg/dL    Creatinine 8.58 8.50 - 0.95 mg/dL    GFR,Caucasian 98 *    GFR,Black 113 *    Calcium 9.7 8.6 - 10.2 mg/dL   Magnesium    Collection Time: 03/16/15  7:44 AM   Result Value Ref Range    Magnesium 1.8 1.3 - 2.1 mEq/L       Pt states pain improved to 6/10   Pt will try drinking coffee - states is feeling tired after having been up all night

## 2015-03-16 NOTE — ED Notes (Signed)
To CT via wheelchair

## 2015-03-16 NOTE — ED Provider Progress Notes (Addendum)
ED Provider Progress Note    Reviewed with radiology   Pt with mucosal thickening of ethomid sinuses  No acute intracranial process  Pt drank coffee - states feeling "much better"  Not resolved  Will try crackers  Anticipate will d/c home with abx           Costella Hatcher, MD, 03/16/2015, 9:40 AM     Pt up and ambulate without assistance  Persistent mild photphobia  Pt states improved 3-4 of 10  Will d/c with Rx doxy  Saline nasal spray  Oxycodone (pt states can tolerate this)

## 2015-03-16 NOTE — ED Notes (Signed)
Pt states "  I am going out to eat when I leave here."

## 2015-03-16 NOTE — ED Provider Notes (Signed)
History     Chief Complaint   Patient presents with    Headache       HPI Comments: Patient presents to the emergency department through ambulatory triage.  Patient with a medical history significant for multiple sclerosis, fibromyalgia, migraine headaches, and chronic back pain.  Patient states she's had head congestion and a headache for the last 2-3 days.  Patient states yesterday the headache significantly worse.  Patient states is mostly in the front but some in the occiput.  Patient states this is similar to her previous migraines but more intense.  Patient states the pain distribution is the same as her migraines.  Patient states she is photosensitive.  Patient does have nausea and vomiting.  No blood no bilious.  Patient denies fevers or chills.  Patient denies trauma.  Patient states she was stretching her neck and felt a "pop" that seemed to make the headache worse.  Patient took 600 mg ibuprofen at 3 AM without any relief.  He states she is to take Imitrex but has not taken for over 5 years.  Patient states she's not actively being treated for her multiple sclerosis.         History provided by:  Patient and spouse      Past Medical History   Diagnosis Date    Multiple sclerosis     Crohn's disease     Fibromyalgia     Generalized headaches             Past Surgical History   Procedure Laterality Date    Subtotal colectomy      Umbilical hernia repair      Cesarian      Back surgery       spinal fusion - lower       Family History   Problem Relation Age of Onset    Cancer Mother     Cancer Father     Bleeding prob Sister     Alcohol abuse Brother          Social History      reports that she has been smoking Cigarettes.  She has been smoking about 0.25 packs per day. She does not have any smokeless tobacco history on file. Her alcohol, drug, and sexual activity histories are not on file.    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   HENT: Positive for congestion.    Eyes: Positive for photophobia. Negative for visual disturbance.   Respiratory: Negative for shortness of breath and wheezing.    Cardiovascular: Negative.  Negative for chest pain.   Gastrointestinal: Positive for nausea and vomiting. Negative for diarrhea.   Endocrine:  Negative.    Genitourinary: Negative.    Musculoskeletal: Negative.    Skin: Negative.    Neurological: Positive for headaches. Negative for dizziness and light-headedness.   Hematological: Negative.    Psychiatric/Behavioral: Negative.  Negative for confusion.       Physical Exam     ED Triage Vitals   BP Heart Rate Heart Rate (via Pulse Ox) Resp Temp Temp src SpO2 O2 Device O2 Flow Rate   03/16/15 0724 03/16/15 0724 -- 03/16/15 0724 03/16/15 0724 03/16/15 0724 03/16/15 0724 03/16/15 0724 --   143/87 mmHg 79  18 36.6 C (97.9 F) TEMPORAL 97 % None (Room air)       Weight           03/16/15 0724           74.844 kg (165 lb)               Physical Exam   Constitutional: She is oriented to person, place, and time. She appears well-developed and well-nourished. No distress.   Pt appears uncomfortable, eyes covered   HENT:   Head: Normocephalic and atraumatic.   Right Ear: External ear normal.   Left Ear: External ear normal.   Nose: Nose normal.   Mouth/Throat: Oropharynx is clear and moist.   Eyes: Conjunctivae are normal. Right eye exhibits no discharge. Left eye exhibits no discharge.    + photophobia   Neck: Normal range of motion. Neck supple. No tracheal deviation present.   Cardiovascular: Normal rate, regular rhythm and intact distal pulses.    No murmur heard.  Pulmonary/Chest: Effort normal. No stridor. No respiratory distress. She has no wheezes. She exhibits no tenderness.   Abdominal: Soft. Bowel sounds are normal. She exhibits no distension. There is no tenderness.   Musculoskeletal: Normal range of motion.   Neurological: She is alert and oriented to person, place, and time. No cranial nerve deficit.   Skin: Skin is warm and dry. She is not diaphoretic.   Psychiatric: She has a normal mood and affect. Her behavior is normal. Thought content normal.   Vitals reviewed.      Medical Decision Making      Amount and/or Complexity of Data Reviewed  Clinical lab tests: ordered        Initial Evaluation:  ED First Provider Contact     Date/Time Event User Comments    03/16/15 0720 ED Provider First Contact Audery Amel J Initial Face to Face Provider Contact          Patient seen by me as above    Assessment:  44 y.o., female comes to the ED with headache, head congestion - h/o migraine - worse than usual    Differential Diagnosis includes dehydration, migraine, sinus headache.   Considered SAH  But not worse-headache , pt changes position without much change in discomfort, similar to previous migraine                Plan:   Will check head CT - check for sinusitis other process  Cbc/cmp/urine  IVF  REglan, fentanyl (pt with poor tolerance to multiple opiates)   Unable to give pred or benadryl given allergies  Will give 2 gram Magnesium  Reassess      Costella Hatcher, MD              Costella Hatcher, MD  03/16/15 [phone]

## 2015-03-16 NOTE — Discharge Instructions (Signed)
-   Stay well hydrated - drink plenty of fluids - avoid excess caffeine and excess alcohol  - Okay to take Tylenol every 6 hours  - Okay to take Oxycodone as prescribed - do NOT drive, operate machinery or drink alcohol while taking this medication  - Okay to take medication as prescribed for nausea  - contact your doctor to arrange a follow-up appointment. If you do not have a doctor, below is a list of doctors that accepting new patients

## 2015-03-16 NOTE — ED Notes (Signed)
Plan of Care     Will continue to monitor patient vital signs Q2-4hours, unless more often is advised and evaluate patients pain PRN.  Keep a safe patient environment.  Keep patient and patients family updated on plan of care and tests results if tests ordered.

## 2015-03-16 NOTE — ED Notes (Signed)
Pt continues to c/o photophobia, and Dr. Laural Benes is aware.

## 2015-03-16 NOTE — ED Notes (Signed)
Family at bedside. 

## 2015-03-16 NOTE — ED Notes (Signed)
Headache all yesterday per patient worse over night with nausea.   Not patients "typical" headache per patient

## 2015-04-07 ENCOUNTER — Encounter: Payer: Self-pay | Admitting: Cardiology

## 2015-04-07 ENCOUNTER — Ambulatory Visit: Payer: Self-pay | Admitting: Cardiology

## 2015-04-07 VITALS — BP 139/81 | HR 86 | Ht 64.0 in | Wt 173.0 lb

## 2015-04-07 DIAGNOSIS — G35 Multiple sclerosis: Secondary | ICD-10-CM

## 2015-04-07 NOTE — Progress Notes (Signed)
Dr. Jesusita Oka   Assistant Professor, Division of Cardiology  [facility]  Cardiology, [phone]  [address]  [place], [place] [address]  Office: [phone]   Fax: [phone]    04/07/2015    RE: Ms. [name]    I had the pleasure of seeing your patient [name] in the Cardiology Clinic at  the [facility] Cardiology Outpatient Office on 04/07/2015. Below is a summary of our visit for your reference:    History of Present Illness:  As you know [name] is a 44 y.o. female with significant past medical history of   1. Multiple sclerosis  2. Crohn disease    The patient is here today for follow up for chest pain, palpitations and diaphoresis.      She states that the symptoms have seemed to improved. We went over the reassuring results of her echo stress test and her Holter monitor.     Review of Systems  A full 10 point ROS is otherwise negative    Medications  Current Outpatient Prescriptions   Medication Sig    doxycycline (VIBRAMYCIN) 100 MG capsule Take 1 capsule (100 mg total) by mouth 2 times daily    oxyCODONE (OXY-IR) 5 MG immediate release capsule Take 1 capsule (5 mg total) by mouth every 6 hours as needed for Pain   Max daily dose: 20 mg    metoclopramide (REGLAN) 10 MG tablet Take 1 tablet (10 mg total) by mouth every 8 hours as needed    Ibuprofen-Diphenhydramine HCl 200-25 MG CAPS Take by mouth daily as needed    omeprazole (PRILOSEC) 20 MG capsule Take 20 mg by mouth daily (before breakfast)    loratadine (CLARITIN) 10 MG tablet Take 10 mg by mouth daily     No current facility-administered medications for this visit.         Physical Examination  Filed Vitals:    04/07/15 1501   BP: 139/81   Pulse: 86   Height: 1.626 m ( )   Weight: 78.472 kg (173 lb)    Body mass index is 29.68 kg/(m^2). Body surface area is 1.88 meters squared.  Wt Readings from Last 4 Encounters:   04/07/15 78.472 kg (173 lb)   03/16/15 74.844  kg (165 lb)   03/14/15 78.926 kg (174 lb)   03/03/15 78.926 kg (174 lb)       Gen: NAD alert and oriented  HEENT:  Sclerae anicteric.  Neck is supple with no lymphadenopathy or thyromegaly.  JVP is not elevated.   Cardiovascular: Normal S1-S2 no S3 no S4 without murmurs or rubs.  Respiratory: Normal vesicular breath sounds.  Clear to auscultation bilaterally without rhonchi, rales or wheezes.  GI: Bowel sounds are present.  There is no hepatosplenomegaly, bruits or masses.  Extremities: 2+ dorsalis pedis pulse.  2+ posterior tibial pulse.  No clubbing, cyanosis or edema.    Laboratory/Studies:    BMP/Electrolytes CBC       Component Value Date/Time    NA 136 03/16/2015 0744    K 3.9 03/16/2015 0744    CL 102 03/16/2015 0744    CO2 23 03/16/2015 0744    UN 6 03/16/2015 0744    CREAT 0.74 03/16/2015 0744    CREAT 0.90 04/06/2013 1817    CREAT 0.89 04/06/2013 1118    GLU 101* 03/16/2015 0744    MG 1.8 03/16/2015 0744   ]     Component Value  Date/Time    WBC 9.0 03/16/2015 0744    HGB 14.3 03/16/2015 0744    HCT 42 03/16/2015 0744    PLT 248 03/16/2015 0744   ]   Liver Function Lipids       Component Value Date/Time    AST 17 04/06/2013 1817    ALT 17 04/06/2013 1817    TB 0.7 04/06/2013 1817    DB <0.2 04/06/2013 1817    No results found for: CHOL, LDLC, HDL, TRIG]       @labrcntip (CKTS:3,TROPU:3,TROP:3,RICKMBS:3,MCKMB:3,CKMB:3)@       X-rays the past 24 hours:  No results found.       There is no immunization history on file for this patient.    ECG: NSR without acute ST/T changes    Assessment:   44 y.o. female with significant past medical history of   1. Multiple sclerosis  2. Crohn disease  3. Chest pain likely non-ischemic  4. Nicotine dependence    Plan:  1. No further cardiac evaluation necessary  2. Follow up on an as needed basis  3. Patient has asked for a referral with neurology to reestablish care with a multiple sclerosis specialist. Since she has not found a PCP yet, we went ahead an put in a referral  per her request. She was told to establish with a PCP as soon as possible.     Thank you very much for allowing me to participate in Ms. Sherica Frometa care. If you have any further questions or concerns, please contact me at your earliest convenience.      Sincerely,        Jesusita Oka MD, M.Sc., FACP, [facility], FRCPC,  ST:er    04/07/2015 3:13 PM     DICTATED

## 2015-09-22 ENCOUNTER — Telehealth: Payer: Self-pay

## 2015-09-22 NOTE — Telephone Encounter (Signed)
Previous NP [name] patient calling in regard to an M.S flare up. Grace [name] is requesting to speak with a nurse, and asks to please be contacted back at [phone] to advise.

## 2015-09-22 NOTE — Telephone Encounter (Signed)
Spoke to patient. She was last seen in Jan 2013 by [name]. Currently not on DMT, had Novantrone years ago. She has worsening right leg weakness, has had similar symptoms in the past. She has an appointment with Dr Katrinka Blazing on 10/10. Patient asking if she can get steroids.   Since we have not seen her in >3 years, she would need to be seen in our clinic before we can prescribe any medications or resume treatment. If she has acute needs she can go to the ED or urgent care.

## 2015-10-02 ENCOUNTER — Encounter: Payer: Self-pay | Admitting: Neurology

## 2015-10-02 NOTE — Progress Notes (Signed)
Patient did not show for appointment in the MS clinic.     Greta Doom III, MD   MS fellow

## 2015-10-09 ENCOUNTER — Encounter: Payer: Self-pay | Admitting: Neurology

## 2015-10-09 ENCOUNTER — Ambulatory Visit: Payer: Self-pay | Admitting: Neurology

## 2015-10-09 ENCOUNTER — Ambulatory Visit
Admission: RE | Admit: 2015-10-09 | Discharge: 2015-10-09 | Disposition: A | Discharge status: Home / Self Care | Payer: Self-pay | Source: Ambulatory Visit | Attending: Neurology | Admitting: Neurology

## 2015-10-09 VITALS — BP 130/72 | HR 82 | Ht 64.0 in | Wt 177.0 lb

## 2015-10-09 DIAGNOSIS — F40240 Claustrophobia: Secondary | ICD-10-CM | POA: Diagnosis not present

## 2015-10-09 DIAGNOSIS — R079 Chest pain, unspecified: Secondary | ICD-10-CM | POA: Diagnosis not present

## 2015-10-09 DIAGNOSIS — R61 Generalized hyperhidrosis: Secondary | ICD-10-CM | POA: Diagnosis not present

## 2015-10-09 DIAGNOSIS — G35 Multiple sclerosis: Secondary | ICD-10-CM | POA: Diagnosis not present

## 2015-10-09 LAB — URINALYSIS REFLEX TO CULTURE
Ketones, UA: NEGATIVE
Leuk Esterase,UA: NEGATIVE
Nitrite,UA: NEGATIVE
Protein,UA: NEGATIVE mg/dL
Specific Gravity,UA: 1.017 (ref 1.002–1.030)
pH,UA: 5 (ref 5.0–8.0)

## 2015-10-09 LAB — COMPREHENSIVE METABOLIC PANEL
ALT: 16 U/L (ref 0–35)
AST: 20 U/L (ref 0–35)
Albumin: 4.6 g/dL (ref 3.5–5.2)
Alk Phos: 107 U/L — ABNORMAL HIGH (ref 35–105)
Anion Gap: 14 (ref 7–16)
Bilirubin,Total: 0.3 mg/dL (ref 0.0–1.2)
CO2: 24 mmol/L (ref 20–28)
Calcium: 9.1 mg/dL (ref 8.6–10.2)
Chloride: 102 mmol/L (ref 96–108)
Creatinine: 0.79 mg/dL (ref 0.51–0.95)
GFR,Black: 104 *
GFR,Caucasian: 90 *
Glucose: 87 mg/dL (ref 60–99)
Lab: 9 mg/dL (ref 6–20)
Potassium: 4.7 mmol/L (ref 3.3–5.1)
Sodium: 140 mmol/L (ref 133–145)
Total Protein: 7.1 g/dL (ref 6.3–7.7)

## 2015-10-09 LAB — CBC AND DIFFERENTIAL
Baso # K/uL: 0 10*3/uL (ref 0.0–0.1)
Basophil %: 0.4 %
Eos # K/uL: 0.4 10*3/uL (ref 0.0–0.4)
Eosinophil %: 3.4 %
Hematocrit: 45 % (ref 34–45)
Hemoglobin: 14.8 g/dL (ref 11.2–15.7)
IMM Granulocytes #: 0.1 10*3/uL (ref 0.0–0.1)
IMM Granulocytes: 0.5 %
Lymph # K/uL: 2.4 10*3/uL (ref 1.2–3.7)
Lymphocyte %: 21.2 %
MCH: 32 pg/cell (ref 26–32)
MCHC: 33 g/dL (ref 32–36)
MCV: 97 fL — ABNORMAL HIGH (ref 79–95)
Mono # K/uL: 0.7 10*3/uL (ref 0.2–0.9)
Monocyte %: 6.3 %
Neut # K/uL: 7.8 10*3/uL — ABNORMAL HIGH (ref 1.6–6.1)
Nucl RBC # K/uL: 0 10*3/uL (ref 0.0–0.0)
Nucl RBC %: 0 /100 WBC (ref 0.0–0.2)
Platelets: 302 10*3/uL (ref 160–370)
RBC: 4.6 MIL/uL (ref 3.9–5.2)
RDW: 12.4 % (ref 11.7–14.4)
Seg Neut %: 68.2 %
WBC: 11.4 10*3/uL — ABNORMAL HIGH (ref 4.0–10.0)

## 2015-10-09 LAB — HEPATITIS A,B,C,PANEL
HBV Core Ab: NEGATIVE
HBV S Ab Quant: 0.25 m[IU]/mL
HBV S Ab: NEGATIVE
HBV S Ag: NEGATIVE
Hep C Ab: NEGATIVE
Hepatitis A IGG: POSITIVE

## 2015-10-09 LAB — LIPID PANEL
Chol/HDL Ratio: 4.5
Cholesterol: 211 mg/dL — AB
HDL: 47 mg/dL
LDL Calculated: 107 mg/dL
Non HDL Cholesterol: 164 mg/dL
Triglycerides: 287 mg/dL — AB

## 2015-10-09 LAB — URINE MICROSCOPIC (IQ200)
Hyaline Casts,UA: 1 /lpf (ref 0–2)
RBC,UA: NONE SEEN /hpf (ref 0–2)
WBC,UA: 1 /hpf (ref 0–5)

## 2015-10-09 LAB — TSH: TSH: 1.97 u[IU]/mL (ref 0.27–4.20)

## 2015-10-09 LAB — FOLLICLE STIMULATING HORMONE: FSH: 11.9 m[IU]/mL

## 2015-10-09 LAB — VARICELLA ZOSTER IGG AB: VZV IgG: POSITIVE

## 2015-10-09 MED ORDER — DIAZEPAM 5 MG PO TABS *I*
ORAL_TABLET | ORAL | 0 refills | Status: AC
Start: 2015-10-09 — End: ?

## 2015-10-09 NOTE — Patient Instructions (Signed)
We talked about how you may have a MS relapse right now. If you do not have evidence of a urinary tract infection we will treat you with at least 3 days of IV steroids. We can try to set up home steroids. If you have a urinary tract infection we will start you on antibiotics.     We need to get repeat MRI scans of your brain, cervical and thoracic spine. We can give you some valium for anxiety during the procedure. You should not drive home after the MRI if you take valium.     We will also get some blood work done today in preparation for the possibility of starting a MS medication.     We will see you back in a couple weeks after your MRI scans.     Please call with any questions or concerns.

## 2015-10-09 NOTE — Progress Notes (Addendum)
[name]  [place]  Extended Follow-up Appointment, Last seen in 2013      DISEASE SUMMARY:  Principal neurologic diagnosis: multiple sclerosis  Onset: 1998 - hemibody numbness (prior to pregnancy)  Diagnosis of MS: 2000 - L optic neuritis  Disease course at onset: relapsing   Current disease course: relapsing?  Previous disease therapies: Copaxone (2000-2005), Avenox (2005-2006), Mitoxantrone (2006-2007 - 6 infusions), Copaxone (2007-2009)  Current disease therapies: none  Most recent MRI brain: 2012  Most recent MRI cervical spine: 2012  Most recent MRI thoracic spine: uncertain  CSF: confirmed diagnosis of MS    Chief Complaint: worsening symptoms    History of Presenting Illness:   Grace [name] is a 44 y.o. year old right-handed woman with multiple sclerosis and Crohn's disease who was last seen in the [facility] in 2013. Her disease is summarized above. She has not been followed by another neurologist over the course of the past 3 years. She was not able to continue to follow in clinic due to insurance coverage. However, today she now has Medicare.     Briefly, this is a 44yo woman who had alternating hemibody numbness prior to pregnancy in 1999. Symptoms improved during pregnancy. Then following that pregnancy she developed optic neuritis and was diagnosed with MS. She was treated with Copaxone, then due to breakthrough disease was treated with Avonex until 2006. Due to probable continued disease activity as evidence on MRI of her spinal cord she was treated with mitoxantrone (Novantrone from 2006-2007, for a total of 6 quarterly infusions). Mitoxantrone was discontinued due to weight loss and she was then put back on Copaxone. She stopped Copaxone in 2009 due to intolerance to injections (lipoatrophy).     Over the course of the past 3 years, Grace [name] reports gradual worsening in gait speed, endurance (<0.5 block before her R leg gives out), and balance.  She has had rare falls at home. She also reports gradual worsening of cognitive function including short-term memory, word-finding difficulty and directions.    However, over the course of the past 1-2 weeks she has noticed worsening headaches in the temple region, more so on the R. She reports blurring of vision in both eyes. She also has new R lower facial weakness that is worse in the evening, R hand clumsiness, and worsening R leg and foot weakness. She has worn an ankle-foot orthesis in the past, but has had to wear this again as well as use a cane. She had urinary incontinence once last week.     Additionally, Grace [name] reports more difficulty with depression including anxiety, insomnia and irritability. She denies any suicidal or homicidal ideation. Also, she has had migraine headaches everyday for the past 2 weeks. She reports taking Advil 200mg  every night for the past 2 weeks.      In regards to her Crohn's disease, she had blood in a bowel movement in the past week. Prior to that it was about 4-5 months ago. She has not seen a GI specialist in many years (history of large intestine resection many years ago).     Current Outpatient Prescriptions   Medication Sig    Loperamide HCl (IMODIUM PO) Take 1 tablet by mouth    omeprazole (PRILOSEC) 20 MG capsule Take 20 mg by mouth daily (before breakfast)    diazepam (VALIUM) 5 MG tablet PO. Take 1 tablet 30 minutes prior to MRI. OK to repeat just prior to MRI. MDD 10mg . Do not  drive after MRI.       Past Medical History:  Medical History - Hospitalizations - Surgeries  Past Medical History   Diagnosis Date    Crohn's disease     Fibromyalgia     Generalized headaches     Multiple sclerosis      Past Surgical History   Procedure Laterality Date    Subtotal colectomy      Umbilical hernia repair      Cesarian      Back surgery       spinal fusion - lower     zFamily Medical History:   family history includes Alcohol abuse in her brother; Bleeding prob in  her sister; Cancer in her father and mother; Rheum arthritis in her maternal grandmother and paternal grandmother.    Social History:  Grace [name] lives with her boyfriend and her son (60) and daughter (41). She has not worked since 2002. Her highest level of education is an associate's degree. She currently smokes 1/2 ppd.     Review of Systems  A 16 point ROS was obtained, reviewed with the patient and scanned into EMR. Pertinent positives and negatives were included in the HPI.     Physical Examination  Vitals:    10/09/15 0758   BP: 130/72   Pulse: 82   Weight: 80.3 kg (177 lb)   Height: 1.626 m (5\' 4" )     Hair, skin, nails, and joints were normal.  Neck was supple without Lhermittes phenomenon.  Heart rate & rhythm were regular.  No carotid bruits appreciated.    She was alert and oriented to person, place, and time with normal language, attention and concentration, recent and remote memory, praxis, and intellectual function.  Affect was appropriate.    Visual acuity was 20/40  OD, 20/30 OS. Red desaturation was noted on OD.  Visual fields were full to confrontation.  Pupils were 4 mm and briskly reactive OU to 2 mm with subtle relative afferent pupillary defect on the R.  Funduscopic examination appeared normal.  Ocular ductions were full without nystagmus, but painful behind the R eye.  Facial sensation was normal.  Mild flattening of R NLF full activation, otherwise muscles of facial expression moved normally.  Hearing was normal.  Palatal movements were normal.  Trapezius power and tongue movements were normal.  There was no dysarthria.    Motor tone was normal. There was no pronator drift.      Upper extremities  (R/L)   Grip 5/5   Finger abduction 5/5   Finger extension 5/5   Wrist extension 5/5   Elbow flexion 5/5   Elbow extension 5/5   Shoulder abduction 5/5     Lower extremities  (R/L)   Hip flexion 5/5   Knee extension 5/5   Knee flexion 5/5   Ankle plantarflexion 5/5   Ankle dorsiflexion  4/4+     Deep tendon reflexes:    Biceps 2+/2+  Triceps 2+/2+  Knees 2+/2+   Ankles 2+/2+   Plantar Responses: Flexor    Temperature sensation was decreased on the R arm and leg.  Vibratory sensation was 16 seconds at the toes bilaterally.  Mildly positive Romberg.    No dysmetria with finger-to-nose testing bilaterally.  Finger tapping was mildly decreased on R compared to L (R handed)    Standard gait was ataxic with circumduction on the R. She was unable to tandem walk.    Timed 25-foot walk (sec): 6.3 (last ~  5.0)   Assistive device: None.    QUANTITATIVE SCORES:  Vision 2   Brainstem 2   Pyramidal 2   Cerebellar 2   Sensory 2   B&B 1   Cerebral 2   Ambulation 6.0   EDSS 5.5         REVIEW OF IMAGING STUDIES:    I reviewed the following studies:    MRI brain:  Without and with contrast Date 2012 - 1 small enhancing lesion in the R frontal regino    MRI Cervical spine: without and with contrast Date 2012 - several cord lesions, no new lesions    Results for ERIENNE, SPELMAN (MRN [id number]) as of 10/09/2015 14:20   Ref. Range 10/09/2015 12:54   Color, UA Latest Ref Range: Yellow  Yellow   Appearance,UR Latest Ref Range: Clear  Clear   Glucose,UA Latest Units: mg/dL NORM   Ketones, UA Latest Ref Range: NEGATIVE  NEG   Specific Gravity,UA Latest Ref Range: 1.002 - 1.030  1.017   Blood,UA Latest Ref Range: NEGATIVE  1+ (A)   pH,UA Latest Ref Range: 5.0 - 8.0  5.0   Protein,UA Latest Ref Range: NEGATIVE mg/dL NEG   Nitrite,UA Latest Ref Range: NEGATIVE  NEG   Leuk Esterase,UA Latest Ref Range: NEGATIVE  NEG   RBC,UA Latest Ref Range: 0 - 2 /hpf None Seen   WBC,UA Latest Ref Range: 0 - 5 /hpf 1   Bacteria,UA Unknown 1+   Hyaline Casts,UA Latest Ref Range: 0 - 2 /lpf 1   Squam Epithel,UA Latest Ref Range: 0-1+  2+ (A)   Mucus,UA Unknown Present         Assessment:     ICD-10-CM ICD-9-CM    1. Multiple sclerosis G35 340 Urinalysis with reflex to culture      Stratify JCV w/Index w/Relex to Inhibit      CBC and  differential      Comprehensive metabolic panel      Vitamin D      Lymphocyte subset (T & B cells)      Varicella zoster IgG antibody      TB AG t-cell stimulation      Hepatitis A,B,C,panel      Tetanus toxoid, IgG      MR head without and with contrast      MR cervical spine without and with contrast      MR spine thoracic without and with contrast      Urinalysis with reflex to culture   2. Claustrophobia F40.240 300.29 diazepam (VALIUM) 5 MG tablet       Grace [name] is a 44yo woman with multiple sclerosis and Crohn's disease presenting for follow-up after not being seen in 3 years. She has not been on a immunotherapy since 2009. Her history today is concerning for active relapse with R Opitc neuritis, R face, arm and leg weakness (L brainstem or internal capsule lesion or cerebral + spinal cord). It is not clear if she has had either extensive relapsing disease over the past 3 years or progressive disease resulting in gradual disability accumulation in addition to her current symptoms consistent with an acute relapse. Given new urinary symptoms, we would like to check a UA prior to treating with IV steroids. Following the visit her UA was found to be normal. We will set her up with 3 days of home IV Methylprednisolone (1g daily). We will obtain pre-immunotherapy labs (consideration of starting Natalizumab, Rituximab, or Gilenya). It will be  important that she re-establish GI care for her Crohn's disease in consideration of possible immunotherapy. Additionally repeat MRI of the brain and spinal cord for lesion accrual will be necessary to determine need for potent immunotherapy.     Plan:  1. 3 days of IV methylprednisolone (1 g daily). No taper as she does not tolerate PO steroids  2. Laboratory evaluation for consideration of immunotherapy  3. MRI of the brain, cervical and thoracic spine without and with contrast for disease "staging" prior to next appointment  4. Follow-up in ~ 2 weeks    Grace [name] was seen with Dr. Kellie Moor.    Thank you for allowing [place] to participate in Grace [name]'s healthcare.     If you have questions or concerns please do not hesitate to call our clinic at [phone].    Grace Doom III, MD, Neuroimmunology Fellow    I saw and evaluated the patient with Dr. Katrinka Blazing . I have reviewed and edited his note and confirm the findings and plan of care as documented above.

## 2015-10-10 ENCOUNTER — Telehealth: Payer: Self-pay

## 2015-10-10 ENCOUNTER — Telehealth: Payer: Self-pay | Admitting: Neurology

## 2015-10-10 DIAGNOSIS — G479 Sleep disorder, unspecified: Secondary | ICD-10-CM

## 2015-10-10 LAB — LYMPHOCYTE SUBSET (T & B CELLS)
B LYM #(CD19): 250 cells/uL (ref 120–725)
B LYM %(CD19): 10 % (ref 7–36)
CD4#: 1658 cells/uL (ref 496–2186)
CD4%: 64 % (ref 32–71)
CD4/CD8: 3.3 — ABNORMAL HIGH (ref 0.7–3.0)
CD8#: 496 cells/uL (ref 177–1137)
NK LYM #(CD16+56): 152 cells/uL (ref 37–758)
NK LYM %(CD16+56): 6 % (ref 4–26)
NK LYM# (CD3+16+56+): 66 cells/uL
NK LYM% (CD3+16+56+): 3 %
T LYM #(CD3): 2145 cells/uL (ref 754–2810)
T LYM %(CD3): 83 % (ref 54–87)
T LYM# (CD3+4+8+): 33 cells/uL
T LYM# (CD3+4-8-): 23 cells/uL
T LYM% (CD3+4+8+): 1 %
T LYM% (CD3+4-8-): 1 %
T Suppress %(CD8): 19 % (ref 10–38)

## 2015-10-10 LAB — TETANUS TOXOID, IGG: Tetanus Ab: 2.6 IU/mL

## 2015-10-10 MED ORDER — HEPARIN LOCK FLUSH 10 UNIT/ML IJ SOLN WRAPPED *I*
10.0000 [IU] | Freq: Every day | INTRAVENOUS | 0 refills | Status: AC | PRN
Start: 2015-10-10 — End: 2015-10-12

## 2015-10-10 MED ORDER — METHYLPREDNISOLONE SOD SUCC 1000 MG IJ SOLR (125 MG/ML) *WRAPPED*
1000.0000 mg | Freq: Every day | INTRAMUSCULAR | 2 refills | Status: AC
Start: 2015-10-10 — End: 2015-10-13

## 2015-10-10 MED ORDER — IV CATHETER MISC
0 refills | Status: AC
Start: 2015-10-10 — End: ?

## 2015-10-10 MED ORDER — ZOLPIDEM TARTRATE 5 MG PO TABS *I*
5.0000 mg | ORAL_TABLET | Freq: Every evening | ORAL | 0 refills | Status: AC | PRN
Start: 2015-10-10 — End: 2015-10-15

## 2015-10-10 MED ORDER — SODIUM CHLORIDE 0.9 % INJ (FLUSH) WRAPPED *I*
10.0000 mL | 0 refills | Status: AC | PRN
Start: 2015-10-10 — End: 2015-10-13

## 2015-10-10 NOTE — Telephone Encounter (Signed)
At home steroid treatment for three days and it makes her very wired and makes her not be able to sleep, she wants to know if the MD can prescribe sleeping medication for this.

## 2015-10-10 NOTE — Telephone Encounter (Signed)
Will add to referral call note and defer to Dr. Katrinka Blazing.  Plan to close.

## 2015-10-10 NOTE — Telephone Encounter (Signed)
Noted. Added chlorphemiramine to medication list.     Greta Doom III, MD

## 2015-10-10 NOTE — Telephone Encounter (Signed)
Call routed to NP pool noted.  Pt is requesting something for sleep due to SEs of IVMP.      Please advise as pt has allergy to benadryl.

## 2015-10-10 NOTE — Telephone Encounter (Signed)
Patient called back to state she took [facility] brand of chlorphemiramine

## 2015-10-10 NOTE — Telephone Encounter (Signed)
Rx for Ambien 5mg  nightly prn, Q = 5. I stop checked and OK.     Greta Doom III, MD

## 2015-10-10 NOTE — Telephone Encounter (Signed)
Attempted to contact pt on (M)  to clarify allergic reaction to prednisone (itching, rash, hives, difficulty breathing, etc.).  Left VM requesting a return call.    Called pt at home.  She states she has had a subtotal colectomy, so oral prednisone does not get absorbed.  In addition it makes her jittery, with an upset stomach.    Pt has had IV steroids in the past (with [facility]) and states she experiences the "normal SEs".  Will refer to [facility] as pt was happy with their care in the past.

## 2015-10-11 ENCOUNTER — Encounter: Payer: Self-pay | Admitting: Gastroenterology

## 2015-10-11 DIAGNOSIS — R296 Repeated falls: Secondary | ICD-10-CM | POA: Diagnosis not present

## 2015-10-11 DIAGNOSIS — R159 Full incontinence of feces: Secondary | ICD-10-CM | POA: Diagnosis not present

## 2015-10-11 DIAGNOSIS — F329 Major depressive disorder, single episode, unspecified: Secondary | ICD-10-CM | POA: Diagnosis not present

## 2015-10-11 DIAGNOSIS — Z452 Encounter for adjustment and management of vascular access device: Secondary | ICD-10-CM | POA: Diagnosis not present

## 2015-10-11 DIAGNOSIS — G35 Multiple sclerosis: Secondary | ICD-10-CM | POA: Diagnosis not present

## 2015-10-11 DIAGNOSIS — M797 Fibromyalgia: Secondary | ICD-10-CM | POA: Diagnosis not present

## 2015-10-11 DIAGNOSIS — G43909 Migraine, unspecified, not intractable, without status migrainosus: Secondary | ICD-10-CM | POA: Diagnosis not present

## 2015-10-11 DIAGNOSIS — Z91040 Latex allergy status: Secondary | ICD-10-CM | POA: Diagnosis not present

## 2015-10-11 DIAGNOSIS — Z7952 Long term (current) use of systemic steroids: Secondary | ICD-10-CM | POA: Diagnosis not present

## 2015-10-11 DIAGNOSIS — R2689 Other abnormalities of gait and mobility: Secondary | ICD-10-CM | POA: Diagnosis not present

## 2015-10-11 DIAGNOSIS — M2550 Pain in unspecified joint: Secondary | ICD-10-CM | POA: Diagnosis not present

## 2015-10-11 DIAGNOSIS — K5090 Crohn's disease, unspecified, without complications: Secondary | ICD-10-CM | POA: Diagnosis not present

## 2015-10-11 DIAGNOSIS — Z0000 Encounter for general adult medical examination without abnormal findings: Secondary | ICD-10-CM | POA: Diagnosis not present

## 2015-10-11 DIAGNOSIS — Z1239 Encounter for other screening for malignant neoplasm of breast: Secondary | ICD-10-CM | POA: Diagnosis not present

## 2015-10-11 DIAGNOSIS — K5190 Ulcerative colitis, unspecified, without complications: Secondary | ICD-10-CM | POA: Diagnosis not present

## 2015-10-11 LAB — TB AG T-CELL STIMULATION: TB Ag T-Cell Stimulation: 0

## 2015-10-12 DIAGNOSIS — K5090 Crohn's disease, unspecified, without complications: Secondary | ICD-10-CM | POA: Diagnosis not present

## 2015-10-12 DIAGNOSIS — R2689 Other abnormalities of gait and mobility: Secondary | ICD-10-CM | POA: Diagnosis not present

## 2015-10-12 DIAGNOSIS — G35 Multiple sclerosis: Secondary | ICD-10-CM | POA: Diagnosis not present

## 2015-10-12 DIAGNOSIS — R296 Repeated falls: Secondary | ICD-10-CM | POA: Diagnosis not present

## 2015-10-12 DIAGNOSIS — F329 Major depressive disorder, single episode, unspecified: Secondary | ICD-10-CM | POA: Diagnosis not present

## 2015-10-12 DIAGNOSIS — K5190 Ulcerative colitis, unspecified, without complications: Secondary | ICD-10-CM | POA: Diagnosis not present

## 2015-10-13 ENCOUNTER — Telehealth: Payer: Self-pay | Admitting: Neurology

## 2015-10-13 DIAGNOSIS — R2689 Other abnormalities of gait and mobility: Secondary | ICD-10-CM | POA: Diagnosis not present

## 2015-10-13 DIAGNOSIS — R296 Repeated falls: Secondary | ICD-10-CM | POA: Diagnosis not present

## 2015-10-13 DIAGNOSIS — K5190 Ulcerative colitis, unspecified, without complications: Secondary | ICD-10-CM | POA: Diagnosis not present

## 2015-10-13 DIAGNOSIS — F329 Major depressive disorder, single episode, unspecified: Secondary | ICD-10-CM | POA: Diagnosis not present

## 2015-10-13 DIAGNOSIS — K5090 Crohn's disease, unspecified, without complications: Secondary | ICD-10-CM | POA: Diagnosis not present

## 2015-10-13 DIAGNOSIS — G35 Multiple sclerosis: Secondary | ICD-10-CM | POA: Diagnosis not present

## 2015-10-13 NOTE — Telephone Encounter (Signed)
Misty Stanley from [facility] called she states they have finished the 3 day steroid and they are discharging the pt. Can call [phone]

## 2015-10-16 LAB — VITAMIN D
25-OH VIT D2: <4 ng/mL
25-OH VIT D3: 21 ng/mL
25-OH Vit Total: 21 ng/mL — ABNORMAL LOW (ref 30–60)

## 2015-10-16 LAB — STRATIFY JCV W/INDEX W/REFLEX TO INHIBITION
STJIX - Index Value: 0.93
Stratify JCV AB with reflex to inhibition: POSITIVE

## 2015-10-20 DIAGNOSIS — G35 Multiple sclerosis: Secondary | ICD-10-CM | POA: Diagnosis not present

## 2015-10-23 ENCOUNTER — Encounter: Payer: Self-pay | Admitting: Neurology

## 2015-10-23 ENCOUNTER — Telehealth: Payer: Self-pay | Admitting: Neurology

## 2015-10-23 ENCOUNTER — Ambulatory Visit: Payer: Self-pay | Admitting: Neurology

## 2015-10-23 VITALS — BP 146/89 | HR 86 | Ht 64.0 in | Wt 177.0 lb

## 2015-10-23 DIAGNOSIS — G43909 Migraine, unspecified, not intractable, without status migrainosus: Secondary | ICD-10-CM | POA: Diagnosis not present

## 2015-10-23 DIAGNOSIS — N319 Neuromuscular dysfunction of bladder, unspecified: Secondary | ICD-10-CM | POA: Diagnosis not present

## 2015-10-23 DIAGNOSIS — G35 Multiple sclerosis: Secondary | ICD-10-CM | POA: Diagnosis not present

## 2015-10-23 DIAGNOSIS — K50919 Crohn's disease, unspecified, with unspecified complications: Secondary | ICD-10-CM

## 2015-10-23 MED ORDER — NORTRIPTYLINE HCL 25 MG PO CAPS *I*
25.0000 mg | ORAL_CAPSULE | Freq: Every evening | ORAL | 5 refills | Status: AC
Start: 2015-10-23 — End: ?

## 2015-10-23 MED ORDER — TAMSULOSIN HCL 0.4 MG PO CAPS *I*
0.4000 mg | ORAL_CAPSULE | Freq: Every evening | ORAL | 5 refills | Status: AC
Start: 2015-10-23 — End: ?

## 2015-10-23 NOTE — Progress Notes (Addendum)
[facility]  [place]  Follow-Up VISIT    DISEASE SUMMARY:  Principal neurologic diagnosis: multiple sclerosis  Onset: 1998 - hemibody numbness (prior to pregnancy)  Diagnosis of MS: 2000 - L optic neuritis  Disease course at onset: relapsing   Current disease course: relapsing?  Previous disease therapies: Copaxone (2000-2005), Avenox (2005-2006), Mitoxantrone (2006-2007 - 6 infusions), Copaxone (2007-2009)  Current disease therapies: none  Most recent MRI brain: 10/20/2015  Most recent MRI cervical spine: 10/20/2015  Most recent MRI thoracic spine: 10/20/2015  CSF: confirmed diagnosis of MS    History of Presenting Illness:   Grace [name] was last seen in clinic on 10/09/2015. At that time Grace [name] was seen in clinic for the first time in 3 years. She had been off of all MS treatment since 2009. We were considering putting her back on a disease modifying therapy, but needed to obtain MRI scans of her CNS for lesion burden assessment. Additionally, she had evidence of optic neuritis and R leg weakness and was treated with 3 days of IV Solumedrol    Since then, Grace [name] notes no significant improvement in vision or pain behind her eyes. She does report improvement in R leg weakness. She also continues to have daily headaches. She stopped taking ibuprofen and tylenol daily, but then switched to sumatriptan without any sustained improvement (continues to rebound).     She denies any new neurological symptoms. She is interested in her MRI results.      Continued Symptoms   Migraine headache - stopped Ibuprofen and Tylenol, started on sumatriptan and taking daily   Urinary - difficulty stopping, incomplete emptying      Current Outpatient Prescriptions   Medication Sig    SUMAtriptan (IMITREX) 25 MG tablet Take 25 mg by mouth as needed for Migraine   Take at onset of headache. May repeat once in 2 hours.    IV Catheter MISC Insert 22g peripheral IV and leave in place.  Saline lock for duration of infusion and discontinue upon completion of infusion course.    chlorpheniramine (CHLOR-TRIMETON) 4 MG tablet Take 4 mg by mouth every 6 hours as needed for Allergies    Loperamide HCl (IMODIUM PO) Take 1 tablet by mouth    diazepam (VALIUM) 5 MG tablet PO. Take 1 tablet 30 minutes prior to MRI. OK to repeat just prior to MRI. MDD . Do not drive after MRI.    omeprazole (PRILOSEC) 20 MG capsule Take 20 mg by mouth daily (before breakfast)    tamsulosin (FLOMAX) 0.4 MG capsule Take 1 capsule (0.4 mg total) by mouth every evening    nortriptyline (PAMELOR) 25 MG capsule Take 1 capsule (25 mg total) by mouth nightly       Review of Systems  A 16 point ROS was obtained, reviewed with the patient and scanned into EMR. Pertinent positives and negatives were included in the HPI.       Physical Examination  Vitals:    10/23/15 0916   BP: 146/89   Pulse: 86   Weight: 80.3 kg (177 lb)   Height: 1.626 m ( )     Hair, skin, nails, and joints were normal.  Neck was supple without Lhermittes phenomenon.     She was alert and oriented to person, place, and time with normal language, attention and concentration, recent and remote memory, praxis, and intellectual function.  Affect was appropriate.    Visual acuity was 20/30  OD, 20/20 OS. Mild red  desaturation was noted on OD.  Visual fields were full to confrontation.  Ocular ductions were full without nystagmus.  Facial sensation was normal.  Muscles of facial expression moved normally.  Hearing was normal.  Palatal movements were normal.  Trapezius power and tongue movements were normal.  There was no dysarthria.    Motor tone was normal.     Lower extremities  (R/L)   Hip flexion 4+/5   Knee extension 5/5   Knee flexion 5/5   Ankle plantarflexion 5/5   Ankle dorsiflexion 4+/5     Standard gait was notable for mild circumduction on the R with foot drop - not wearing ankle-foot orthesis today  Timed 25-foot walk (sec): 6.6 (last 6.3 2  weeks ago)   Assistive device: None.    QUANTITATIVE SCORES:  Vision 2   Brainstem 2   Pyramidal 2   Cerebellar 2   Sensory 2   B&B 1   Cerebral 2   Ambulation 6.0   EDSS 5.5       REVIEW OF IMAGING STUDIES:    I reviewed the following studies:    MRI brain:  Without and with contrast Date: 10/20/2015  "Multiple scattered foci of abnormal signal in the supratentorial   brain, some of which are new compared to 2012. There are no foci of   abnormal restricted diffusion or enhancement. These findings are   compatible with interval progression of nonacute demyelinating   disease in this patient with the clinical diagnosis of multiple   Sclerosis.    "Focal high signal in the posterior right optic tract, series 9 image   23, likely representing atrophy may been present on prior exams,   although less well defined. "    MRI Cervical and thoracic spine: without and with contrast Date: 10/20/2015  "Multiple patchy foci of abnormal intramedullary signal in the   cervical spinal cord, similar to 2012, compatible with nonacute   demyelinating disease in this patient with clinical diagnosis of   multiple sclerosis. No abnormal foci of intramedullary enhancement in   the cervical cord.    "Intramedullary lesions in the lower thoracic cord at the level of   T9-T10 and T11-T12, likely representing nonacute demyelinating   disease in this patient with the clinical diagnosis of multiple   sclerosis. No enhancing lesions to suggest active demyelination."    REVIEW OF LABORATORY STUDIES: on 10/09/15 normal CMP, mildly elevated WBC#, rest of evaluation below  Results for Grace, [name] (MRN [id number]) as of 10/23/2015 15:25   Ref. Range 10/09/2015 09:56   Cholesterol Latest Units: mg/dL 025 (A)   Triglycerides Latest Units: mg/dL 427 (A)   HDL Latest Units: mg/dL 47   LDL Calculated Latest Units: mg/dL 062   Non HDL Cholesterol Latest Units: mg/dL 376   Chol/HDL Ratio Unknown 4.5   25-OH VIT D2 Latest Units: ng/mL <4   25-OH VIT  D3 Latest Units: ng/mL 21   25-OH Vit Total Latest Ref Range: 30 - 60 ng/mL 21 (L)   TSH Latest Ref Range: 0.27 - 4.20 uIU/mL 1.97   FSH Latest Units: mIU/mL 11.9   T LYM %(CD3) Latest Ref Range: 54 - 87 % 83   T LYM #(CD3) Latest Ref Range: 754 - 2810 cells/uL 2145   CD4% Latest Ref Range: 32 - 71 % 64   CD4# Latest Ref Range: 496 - 2186 cells/uL 1658   T Suppress %(CD8) Latest Ref Range: 10 - 38 % 19   CD8#  Latest Ref Range: 177 - 1137 cells/uL 496   B LYM %(CD19) Latest Ref Range: 7 - 36 % 10   B LYM #(CD19) Latest Ref Range: 120 - 725 cells/uL 250   CD4/CD8 Latest Ref Range: 0.7 - 3.0  3.3 (H)   NK LYM %(CD16+56) Latest Ref Range: 4 - 26 % 6   NK LYM #(CD16+56) Latest Ref Range: 37 - 758 cells/uL 152   T LYM% (CD3+4+8+) Latest Units: % 1   T LYM# (CD3+4+8+) Latest Units: cells/uL 33   T LYM% (CD3+4-8-) Latest Units: % 1   T LYM# (CD3+4-8-) Latest Units: cells/uL 23   NK LYM% (CD3+16+56+) Latest Units: % 3   NK LYM# (CD3+16+56+) Latest Units: cells/uL 66   Tetanus Ab Latest Units: IU/mL 2.6   VZV IgG Unknown POSITIVE   Hepatitis A IGG Unknown POS   HBV S Ag Unknown NEG   HBV S Ab Unknown NEG   HBV S Ab Quant Latest Units: mIU/mL 0.25   HBV Core Ab Unknown NEG   HBV Interp Unknown see below   Hep C Ab Unknown NEG   TB Ag T-Cell Stimulation Unknown .Negative   Stratify JCV AB with reflex to inhibition Unknown POSITIVE   STJIX - Index Value Unknown 0.93       Assessment:     ICD-10-CM ICD-9-CM    1. Multiple sclerosis G35 340 PNEUMOCOCCAL IGG AB      tamsulosin (FLOMAX) 0.4 MG capsule   2. Neurogenic bladder N31.9 596.54 tamsulosin (FLOMAX) 0.4 MG capsule   3. Migraine headache G43.909 346.90 nortriptyline (PAMELOR) 25 MG capsule     Grace [name] is a 44yo woman with relapsing MS with activity and progression not currently on a disease modifying therapy (DMT). She has had partial recovery with IV steroids and we expect that this will continue to improve. We discussed DMT options at length including orals,  rituximab and natalizumab. Given her positive JC virus Ab serology, history of mitoxantrone treatment we would not put her on natalizumab due to high risk of PML. She is concerned about absorption of oral medications due to her Crohn's disease. From an MS standpoint we would strongly consider treatment of rituximab. However we would like input from gastroenterology about the activity of her Crohn's disease and whether this treatment could be effective.     In regards to symptomatic treatment, we will start nortriptyline  daily for her migraine headaches. We advised her to avoid daily use of all OTCs, triptans, opioids as this can lead to continued rebound analgesic headaches.     For her neurogenic bladder, we will try tamsulosin 0.73m daily due to her symptoms of hesitation and complete emptying. We wills start this medication 2-3 weeks after starting nortriptyline.     At her next appointment we will discuss treatment with Ampyra for her MS-related gait disorder.     Plan:   1. PA paperwork completed for Rituximab  2. She will need appt with GI for Crohn's disease prior to starting rituximab  3. Check pneumococcal Ab for evidence of prior vaccination (reports ~ 10 years ago)  4. Nortriptyline  nightly for migraine prophylaxis. Stop daily use of OTCs, triptans  5. Tamsulosin 0.4mg  daily (start 2-3 weeks after nortriptyline)  6. F/U in ~ 1 month    Grace [name] was seen with Dr. Kellie Moor.     Thank you for allowing [place] to participate in Grace [name]'s healthcare.     If you have questions or concerns  please do not hesitate to call our clinic at [phone].    Greta Doom III, MD, Neuroimmunology Fellow    I saw and evaluated the patient with Dr. Katrinka Blazing. I have reviewed and edited hisnote and confirm the findings and plan of care as documented above.  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  She is JCV seropositive,  prior tx with novantrone.  Also has untreated Crohn's.  Unlikely to find immunotherapy that will adequately cover both disorders (would not use natalizumab given +JCV Ab andprior novantrone use, combo ofwhich significantly increases PML risk).  Rituximab seems best option given her progressive-relapsing disease course.  However, GI input is needed as well.

## 2015-10-23 NOTE — Telephone Encounter (Signed)
Follow-up question from today's office visit. Patient would like to know if Dr. Katrinka Blazing has a particular GI group or doctor that he'd like to refer her to. She states she had a referral from her PCP to Dr. Aquilla Hacker of [facility], however they cannot get her in until 12/15. She states she needs to see a GI doctor ASAP so she can start her M.S treatment ASAP. Please advise.

## 2015-10-23 NOTE — Patient Instructions (Signed)
We would like you to get another blood test today.     You will need to get your flu vaccine after your cold has stopped.     We need you to see your GI specialist to discuss treatment options. We talked about treating your MS with a medication called Rituximab. We are not certain that this would help your Crohn's disease.     For your migraine headaches you need to stop taking any over the counter or headache medications on a daily basis as this can worsen your headaches. Drink plenty of fluids and stay rested as this will help.   We will start a migraine preventative medication called nortriptyline that you can start to take nightly. This may take up to 2-3 weeks to have an effect.     In about 2-3 weeks you can start to take a medication called Flomax to help empty your bladder.     At your next appointment in about 1 month we can talk about starting another medication to help your walking called Ampyra.     We will see you back in clinic in about 1 month or earlier if needed.     Greta Doom III, MD and Dr. Kellie Moor

## 2015-10-24 ENCOUNTER — Encounter: Payer: Self-pay | Admitting: Gastroenterology

## 2015-10-24 NOTE — Telephone Encounter (Signed)
I talked to [place]. I have placed referral to [facility].     Greta Doom III, MD   MS Fellow

## 2015-10-27 ENCOUNTER — Telehealth: Payer: Self-pay | Admitting: Neurology

## 2015-10-27 NOTE — Telephone Encounter (Signed)
No p/a needed for rituxan all set on my end

## 2015-10-31 ENCOUNTER — Telehealth: Payer: Self-pay | Admitting: Neurology

## 2015-10-31 ENCOUNTER — Encounter: Payer: Self-pay | Admitting: Gastroenterology

## 2015-10-31 NOTE — Telephone Encounter (Signed)
This was faxed over today will re-fax

## 2015-10-31 NOTE — Telephone Encounter (Signed)
Grace [name] states that she faxed a face to face to [phone].  Inquires on the status. If complete, please fax to [phone].  Please call with questions.

## 2015-10-31 NOTE — Telephone Encounter (Signed)
We have no record of this being received

## 2015-11-05 ENCOUNTER — Encounter: Payer: Self-pay | Admitting: Neurology

## 2015-11-07 ENCOUNTER — Encounter: Payer: Self-pay | Admitting: Gastroenterology

## 2015-11-14 NOTE — Telephone Encounter (Signed)
Called GI, confirmed that referral is authorized and good to go. Left message for patient stating that the referral to GI has been authorized and should be OK to go ahead and schedule. Provided patient w/ GI clinic's phone number. Asked her to call us back with any further questions.

## 2015-11-14 NOTE — Telephone Encounter (Signed)
Talked to Envi to see if she has been contacted about a GI appointment. She has not yet been contacted - note that referral was placed on 10/24/2015.     Can we check on status of the referral? Thanks!    Greta Doom III, MD   MS fellow

## 2015-11-30 ENCOUNTER — Emergency Department
Admission: EM | Admit: 2015-11-30 | Discharge: 2015-11-30 | Disposition: A | Discharge status: Home / Self Care | Payer: Self-pay | Source: Ambulatory Visit

## 2015-11-30 ENCOUNTER — Telehealth: Payer: Self-pay | Admitting: Neurology

## 2015-11-30 DIAGNOSIS — R059 Cough, unspecified: Secondary | ICD-10-CM

## 2015-11-30 DIAGNOSIS — T7840XA Allergy, unspecified, initial encounter: Secondary | ICD-10-CM

## 2015-11-30 DIAGNOSIS — J029 Acute pharyngitis, unspecified: Secondary | ICD-10-CM

## 2015-11-30 LAB — HM HIV SCREENING OFFERED

## 2015-11-30 MED ORDER — BENZONATATE 100 MG PO CAPS *I*
200.0000 mg | ORAL_CAPSULE | Freq: Once | ORAL | Status: AC
Start: 2015-11-30 — End: 2015-11-30
  Administered 2015-11-30: 200 mg via ORAL
  Filled 2015-11-30: qty 2

## 2015-11-30 MED ORDER — BENZONATATE 100 MG PO CAPS *I*
100.0000 mg | ORAL_CAPSULE | Freq: Three times a day (TID) | ORAL | 0 refills | Status: AC | PRN
Start: 2015-11-30 — End: 2015-12-30

## 2015-11-30 MED ORDER — LIDOCAINE VISCOUS 2 % MT SOLN *I*
5.0000 mL | Freq: Four times a day (QID) | OROMUCOSAL | 0 refills | Status: AC | PRN
Start: 2015-11-30 — End: 2015-12-03

## 2015-11-30 MED ORDER — FAMOTIDINE IN NACL 20 MG/50ML IV SOLN *I*
40.0000 mg | Freq: Once | INTRAVENOUS | Status: AC
Start: 2015-11-30 — End: 2015-11-30
  Administered 2015-11-30: 40 mg via INTRAVENOUS

## 2015-11-30 MED ORDER — METHYLPREDNISOLONE SOD SUCC 125 MG IJ SOLR(62.5MG/ML) *WRAPPED*
125.0000 mg | Freq: Once | INTRAMUSCULAR | Status: AC
Start: 2015-11-30 — End: 2015-11-30
  Administered 2015-11-30: 125 mg via INTRAVENOUS
  Filled 2015-11-30: qty 2

## 2015-11-30 MED ORDER — LIDOCAINE VISCOUS 2 % MT SOLN *I*
5.0000 mL | Freq: Once | OROMUCOSAL | Status: AC
Start: 2015-11-30 — End: 2015-11-30
  Administered 2015-11-30: 5 mL via OROMUCOSAL
  Filled 2015-11-30: qty 15

## 2015-11-30 MED ORDER — FAMOTIDINE 40 MG PO TABS *I*
20.0000 mg | ORAL_TABLET | Freq: Two times a day (BID) | ORAL | 0 refills | Status: AC
Start: 2015-11-30 — End: 2015-12-30

## 2015-11-30 MED ORDER — EPINEPHRINE 1 MG/ML IJ SOLUTION WRAPPED *I*
0.3000 mg | Freq: Once | INTRAMUSCULAR | Status: AC
Start: 2015-11-30 — End: 2015-11-30
  Administered 2015-11-30: 0.3 mg via INTRAMUSCULAR

## 2015-11-30 NOTE — ED Triage Notes (Signed)
Pt with c/o allergic reaction to either flomax or nortriptyline pt developed sob and difficulty swallowing last night worsening today.  Pt with hoarse voice able to speak in full sentences.         Triage Note   Peggyann Juba, RN

## 2015-11-30 NOTE — Discharge Instructions (Signed)
It is very important that you follow up with your primary care provider within 1 day.     Take pepcid as directed.     Use lidocaine as needed for pain.     Stay well hydrated.     Return to the ER for any worsening of symptoms, chest pain, shortness of breath, tongue or throat swelling, or any other new/concerning symptoms.

## 2015-11-30 NOTE — Telephone Encounter (Signed)
Spoke to pt.  Her voice sounds tight and high pitched.  Recommended she call 911 for immediate treatment.  Pt states she has been like this for two weeks and wondered if 911 was necessary.    Writer explained that by continuing to take medication she increased exposure to allergen.  Recommended she at least go to an urgent care.    Pt will do so and wondered where the closest one was.  She will go to [facility] right now.

## 2015-11-30 NOTE — Comprehensive Assessment (Signed)
Contacts: Medical record, nursing, pt, physician   Grace [name] is a 44 year old female who comes in with allergic reaction.      Intervention:   Supportive Counseling   SW met with patient to complete a risk screen. She is here alone. She has no concerns or questions for this Probation officer.   Pt has medical insurance, a PCP, and is able to fill prescriptions. Pt reports that they have supportive people in their life; pt does not report any alcohol or substance abuse, mental health issues, or domestic violence.   Pt denies need for food stamps or food pantries. Patient has adequate social supports, and transportation. Pt has not identified any risks at this time. Please contact SW if this changes.     Plan:    There are no further Social Work needs identified at this time. (Please refer to inpatient social worker if patient is admitted or as needed.)     Abby Potash, LMSW         11/30/15 1813   DSRIP intervention status   DSRIP Intervention status Initial intervention   DSRIP Patient? Yes   Patient Barriers transportation   Patient address confirmation Yes   Patient phone confirmation Yes   Patient insurance confirmation Yes   Patient PCP confirmation Yes   PCP Appt type Follow up appt   Follow up appointment center/address: Other  (Dr.Hoffman)

## 2015-11-30 NOTE — Telephone Encounter (Signed)
I completely agree that she should be seen in ED or urgent care immediately, regardless of reported duration of symptoms. We can not assess a potentially medically emergent situation over the phone.     Greta Doom III, MD

## 2015-11-30 NOTE — Telephone Encounter (Signed)
Patient called because the thinks she having an allergic reaction to medication tamsulosin (FLOMAX) 0.4 MG capsule. Patient tongue is swollen and bumps on her tongue plus her voice is horse. Patient request urgent call back.

## 2015-11-30 NOTE — ED Provider Notes (Addendum)
History     Chief Complaint   Patient presents with    Allergic Reaction     HPI Comments: 44yo female with PMH Crohn's disease, fibromyalgia, multiple sclerosis, presents to ED for throat tightness, voice change, due to likely allergic reaction. Patient believes she may be allergic to flomax that she started 10 days ago. Patient states that since starting she has had a cough with progressive feeling of tightness in her throat. Pt also c/o sore on right side of tongue. Pt able to talk but states when talking it makes it harder to breath. Denies fever/chills.       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 visit related to civilian activity for income:  Not work related    Past Medical History   Diagnosis Date    Crohn's disease     Fibromyalgia     Generalized headaches     Multiple sclerosis         Past Surgical History   Procedure Laterality Date    Subtotal colectomy      Umbilical hernia repair      Cesarian      Back surgery       spinal fusion - lower     Family History   Problem Relation Age of Onset    Cancer Mother     Cancer Father     Bleeding prob Sister     Alcohol abuse Brother     Rheum arthritis Maternal Grandmother     Rheum arthritis Paternal Grandmother        Social History    reports that she has been smoking Cigarettes.  She has been smoking about 0.25 packs per day. She does not have any smokeless tobacco history on file. Her alcohol, drug, and sexual activity histories are not on file.    Living Situation     Questions Responses    Patient lives with Significant Other    Homeless No    Caregiver for other family member     External Services None    Employment Disabled    Domestic Violence Risk No          Problem List     Patient Active Problem List   Diagnosis Code    Multiple Sclerosis G35    Multiple Sclerosis G35    Crohn's Disease K50.90    Nephrolithiasis N20.0    Nicotine Dependence F17.200    Migraine Headache G43.909     Ulcerative Colitis K51.90    Myalgia And Myositis IMO0001    Multiple Sclerosis G35       Review of Systems   Review of Systems   Constitutional: Negative for chills and fever.   HENT: Positive for mouth sores and sore throat. Negative for congestion and ear pain.    Respiratory: Positive for shortness of breath. Negative for cough.    Cardiovascular: Negative for chest pain.   Gastrointestinal: Negative for abdominal pain and nausea.   Skin: Negative for color change.   Neurological: Negative for dizziness, weakness and headaches.   Psychiatric/Behavioral: Negative for confusion.       Physical Exam     ED Triage Vitals   BP Heart Rate Heart Rate (via Pulse Ox) Resp Temp Temp src SpO2 O2 Device O2 Flow Rate   11/30/15 1724 11/30/15 1724 -- 11/30/15 1724 11/30/15 1724 11/30/15 1724 11/30/15 1724 11/30/15 1724 --   126/97 92  16  37 C (98.6 F) TEMPORAL 100 % None (Room air)       Weight           11/30/15 1724           81.6 kg (180 lb)               Physical Exam   Constitutional: She is oriented to person, place, and time. She appears well-developed and well-nourished. No distress.   HENT:   Head: Normocephalic and atraumatic.   Right Ear: External ear normal.   Left Ear: External ear normal.   Mouth/Throat: Oropharynx is clear and moist.   Voice hoarse.  Single aphthous ulcer right side of tongue.   Eyes: Conjunctivae are normal.   Neck: Normal range of motion. Neck supple.   Cardiovascular: Normal rate, regular rhythm and normal heart sounds.    Pulmonary/Chest: Effort normal and breath sounds normal. No respiratory distress. She has no wheezes. She has no rales.   Neurological: She is alert and oriented to person, place, and time.   Skin: Skin is warm. She is not diaphoretic.   Psychiatric: She has a normal mood and affect. Her behavior is normal. Judgment and thought content normal.   Nursing note and vitals reviewed.      Medical Decision Making      Amount and/or Complexity of Data Reviewed  Tests in the  radiology section of CPT: ordered and reviewed        Initial Evaluation:  ED First Provider Contact     Date/Time Event User Comments    11/30/15 1719 ED Provider First Contact Dekorra, Talha Iser Initial Face to Face Provider Contact          Patient seen by me as above    Assessment:  44 y.o.female comes to the ED with hoarse voice, throat swelling, progressive x 10 days, worsening over past day. On arrival, patient is ambulatory with normal gait with normal vital signs and in no acute distress. Lungs are clear to ausculation. There is no visualization of throat swelling.     Differential Diagnosis includes anaphylaxis, delayed allergic reaction, uri, cough, laryngitis                      Plan:   Iv   Epi  Solumedrol  pepcid  Tele  Soft tissue neck  Monitor and reassess    Vivia Birmingham, PA    Supervising physician Laural Benes was immediately available     Vivia Birmingham, [place]  11/30/15 1815      pts tongue and throat feeling much improved with viscous lidocaine  Voice remains hoarse. Being that this is associated with cough, I suspect there may be a viral origin, ?laryngitis or possible esophagitis. Patient has referral and should be following up with GI soon for upper endoscopy. Given symptoms, I informed pt she should do this asap. Patient without difficulty breathing. No tongue swelling. Cough improved with tessalon. Will d/c to home after 4 hours if no complications occur with prednisone, pepcid, benadryl, tessalon, and close f/u with pcp and GI.      Vivia Birmingham, [place]  11/30/15 2101

## 2015-11-30 NOTE — ED Notes (Signed)
Nursing Care Plan:  Will monitor and assess VS and pain scores every 2-4 hours and prn.  Perform frequent rounding prn.  Provide updates to patient and/or cargiver frequently.  Provide support to patient/caregiver as needed.  Teach patient and/or caregivers about patients needs/status working towards discharge.  Patient oriented to room and given call bell.

## 2015-11-30 NOTE — ED Notes (Signed)
Patient added to telemetry per provider order.

## 2015-11-30 NOTE — ED Notes (Signed)
Pt resting comfortable. Pt continues coughing after she yawns. Cough is dry. Pt states " It feels like something is in my throat". Call bell within reach.

## 2015-12-13 ENCOUNTER — Other Ambulatory Visit: Payer: Self-pay

## 2015-12-13 NOTE — Comprehensive Assessment (Signed)
12/13/15 0932   DSRIP intervention status   DSRIP Intervention status Refused intervention   DSRIP Patient? Yes   PAM completed? Refused   Time Spent with Patient (min) 15     [facility]  [employer]   [phone]

## 2015-12-24 HISTORY — PX: HARDWARE REMOVAL: SHX979

## 2016-01-30 ENCOUNTER — Telehealth: Payer: Self-pay

## 2016-01-30 NOTE — Telephone Encounter (Signed)
Called patient to schedule NPV. No answer. Left VM to call back.

## 2016-01-30 NOTE — Telephone Encounter (Signed)
Ms. Zarr is calling to schedule an appointment. Referral in system for Crohn's Disease. Please call the patient to schedule at [phone].

## 2016-02-29 ENCOUNTER — Ambulatory Visit
Admission: RE | Admit: 2016-02-29 | Discharge: 2016-02-29 | Disposition: A | Discharge status: Home / Self Care | Payer: Self-pay | Source: Ambulatory Visit | Attending: Internal Medicine | Admitting: Internal Medicine

## 2016-02-29 ENCOUNTER — Other Ambulatory Visit: Payer: Self-pay | Admitting: Internal Medicine

## 2016-02-29 DIAGNOSIS — R0781 Pleurodynia: Secondary | ICD-10-CM | POA: Diagnosis not present

## 2016-03-01 ENCOUNTER — Ambulatory Visit
Admission: RE | Admit: 2016-03-01 | Discharge: 2016-03-01 | Disposition: A | Discharge status: Home / Self Care | Payer: Self-pay | Source: Ambulatory Visit | Attending: Family Medicine | Admitting: Family Medicine

## 2016-03-01 ENCOUNTER — Other Ambulatory Visit: Payer: Self-pay | Admitting: Family Medicine

## 2016-04-04 ENCOUNTER — Telehealth: Payer: Self-pay | Admitting: Neurology

## 2016-04-04 NOTE — Telephone Encounter (Signed)
Is this patient going on Rituxan? No p/a is needed and no out of pocket costs

## 2016-04-04 NOTE — Telephone Encounter (Signed)
She was supposed to see GI to make sure there was no contraindication in regards to her Crohn's disease. She has not seen GI to date (despite GI reaching out to her to schedule).     [employer]. She has not contacted GI due to recurrent URI in the family. She has the number and will call them.   She also brought up recent panic attacks triggered by loud noises. She denies depression, but reports a lot of stress and anxiety that she is suppressing. I encouraged her to reach out to her counselor that she has seen in the past.     Audie Pinto, MD   MS fellow

## 2016-04-08 NOTE — Telephone Encounter (Signed)
Pt calling to schedule an appointment per referral  She also has multiple sclerosis.  Thank you

## 2016-04-22 NOTE — Telephone Encounter (Signed)
Dr. Zannie Cove current next available NPV without overbooking is 07/01/16

## 2016-04-22 NOTE — Telephone Encounter (Signed)
Called patient to schedule NPV. No answer. Left VM to call back.

## 2016-04-25 NOTE — Telephone Encounter (Signed)
Patient scheduled for 07/01/2016 with Dr Sofie Hartigan and added to the wait list.

## 2016-06-07 ENCOUNTER — Ambulatory Visit: Payer: Self-pay | Admitting: Podiatry

## 2016-06-07 ENCOUNTER — Encounter: Payer: Self-pay | Admitting: Podiatry

## 2016-06-07 VITALS — Ht 64.0 in | Wt 180.0 lb

## 2016-06-07 DIAGNOSIS — Z967 Presence of other bone and tendon implants: Secondary | ICD-10-CM

## 2016-06-07 NOTE — Progress Notes (Signed)
Subjective:     Chief Complaint   Patient presents with    Foot Pain     left foot    Painful retained screw left foot having pain in shoes and walking    Patient ID: Grace [name] is a 45 y.o. female.    Allergy History as of 06/07/16     CODEINE       Noted Status Severity Type Reaction    04/08/11 1429 Edi Conversion, Allergies 06/04/06 Active       Comments:  Created by Conversion - 0;            HYDROMORPHONE       Noted Status Severity Type Reaction    04/08/11 1429 Edi Conversion, Allergies 06/04/06 Active       Comments:  Created by Conversion - 0;            LATEX       Noted Status Severity Type Reaction    04/08/11 1429 Edi Conversion, Allergies 09/10/06 Active       Comments:  Created by Conversion - 0;            PREDNISONE       Noted Status Severity Type Reaction    11/30/15 1726 Earley Favor, PharmD 07/09/11 Active Low Allergy Hives, Other (See Comments)    Comments:  Poor absorption d/t subtotal colectomy, "jittery", upset stomach.     10/10/15 1009 Merry Lofty, RN 07/09/11 Active Low Allergy Other (See Comments)    Comments:  Poor absorption d/t subtotal colectomy, "jittery", upset stomach.     07/09/11 1324 Hawks, Leeroy Bock 07/09/11 Active  Allergy           AMOXICILLIN       Noted Status Severity Type Reaction    07/09/11 1325 Hawks, Leeroy Bock 07/09/11 Active  Allergy           HYDROCODONE-ACETAMINOPHEN       Noted Status Severity Type Reaction    07/09/11 1325 Hawks, Leeroy Bock 07/09/11 Active  Allergy           MORPHINE       Noted Status Severity Type Reaction    04/06/13 1037 Rayburn Felt, RN 04/06/13 Active  Intolerance Nausea And Vomiting    Comments:  shakiness           DIPHENHYDRAMINE       Noted Status Severity Type Reaction    03/14/15 1107 Keemer, Alphonsus Sias, RN 03/14/15 Active   Hives                Patient's medications, allergies, past medical, surgical, social and family histories were reviewed and updated as appropriate, there are no changes with the exception of  none          Objective:   Physical Exam    The patient appears well and is in no apparent distress, the patient is alert and oriented.  Pedal vascular exam:  Dorsalis pedis pulse 2+ bilaterally. Posterior tibial pulse 2+ bilaterally. Extremities: No edema of the lower limbs bilaterally. No varicosities noted. Capillary refill in the toes bilaterally is < 2 seconds. Gradual temperature decrease bilaterally. Patient has normal pedal hair growth noted.    Patient has pes planus feet. There is hyper pronation on weight bearing with eversion of the calcaneus, and abduction of the forefoot.  Pain over the screw head #5 MP joint left foot. The remaining pedal joints were examined for range of motion, crepitus of the joints,  stability and deformity and are unremarkable.        Skin: No scars, rashes, lesions or ecchymosis on the lower extremities. Skin is of normal temperature and turgor   There are no abrasions, infections or dermatitis noted.  The skin is essentially normal with the exception of irritation of the skin over the #5 MP joint left foot                Assessment:         1. Fixation hardware in foot         Plan:      Will schedule excision of the screw under local anesthesia, has been told of the risks benefits and alternatives, wants it done soon is moving to [facility]

## 2016-06-08 NOTE — Procedures (Signed)
Date: 06/08/16    [place] [facility] [address]  [facility] [place]  [address] [place] [address]  Dept: [phone]    Number of Views:  Left foot   3 views    Reason for the exam: Pain    Impression: Retained screw #5 metatarsal s/p tailors bunionectomy left foot    Burnett Sheng, DPM

## 2016-06-17 ENCOUNTER — Encounter: Payer: Self-pay | Admitting: Neurology

## 2016-06-17 ENCOUNTER — Encounter: Payer: Self-pay | Admitting: Gastroenterology

## 2016-07-01 ENCOUNTER — Ambulatory Visit
Admission: RE | Admit: 2016-07-01 | Discharge: 2016-07-01 | Disposition: A | Discharge status: Home / Self Care | Payer: Self-pay | Attending: Gastroenterology | Admitting: Gastroenterology

## 2016-07-01 ENCOUNTER — Telehealth: Payer: Self-pay | Admitting: Gastroenterology

## 2016-07-01 ENCOUNTER — Encounter: Payer: Self-pay | Admitting: Gastroenterology

## 2016-07-01 VITALS — BP 134/81 | HR 73 | Ht 64.0 in | Wt 177.1 lb

## 2016-07-01 DIAGNOSIS — K5090 Crohn's disease, unspecified, without complications: Secondary | ICD-10-CM

## 2016-07-01 DIAGNOSIS — K529 Noninfective gastroenteritis and colitis, unspecified: Secondary | ICD-10-CM

## 2016-07-01 DIAGNOSIS — K219 Gastro-esophageal reflux disease without esophagitis: Secondary | ICD-10-CM

## 2016-07-01 NOTE — Telephone Encounter (Signed)
Cen (within 3 wks) thanks

## 2016-07-01 NOTE — Discharge Instructions (Signed)
Labs  Schedule endoscopy and colonoscopy  Increase Omeprazole to 40mg  once daily.

## 2016-07-01 NOTE — Telephone Encounter (Signed)
CTE within 4 wks ins- [phone] thanks Nathaneil Feagans

## 2016-07-01 NOTE — H&P (Addendum)
Suda Forbess  [id number]     07/01/2016  Lacretia Nicks, MD  [address]  [place], [place] [address]    We had the pleasure of seeing your patient, Grace [name], in the outpatient gastroenterology/hepatology clinic. As you know, she is a 45 y.o. female with a past medical history significant for MS, hernia repair, neurogenic bladder, migraines, anxiety who is referred to our office for crohn's disease. She needed to be evaluated by GI for Crohn's disease prior to starting rituximab.      Previously followed by Dr. Kandis Fantasia. She reports previously having a colonoscopy which showed inflammation. She had a barium burger study and VCE and was diagnosed w/ Crohn's disease.  During this time she would have cecum vovulus and abdominal pain and she was having blood in her stools. She underwent a subtotal colectomy ~2005/2006 with Dr. Marchia Bond at [facility]. She has not followed with a GI doctor for her Crohn's since this surgery. She thinks she may have had another colonoscopy about 6 years ago. She reports having an EGD year ago and cannot recall results other than being told she had a J-shaped stomach.     She reports an allergy to Asacol and sulfa drugs. Previously has received prednisone. She denies being on a biologics or immunomodulators in the past. She has not been on any therapy for crohn's from 2006 to now.    Today she presents with multiple GI symptoms. She is having a BM 8 times a day and this is her baseline. She has urgency and denies tenemsus. She has hematochezia which she describes as blood in the toilet and in the tissue which occurs daily. Bleeding has worsened over the last few months. She denies melena. She will take 2-4 tabs of imodium every other day. She has nausea daily and vomiting 1-2 times per week. She complains of pyrosis and acid regurgitation which occurs daily despite being on Omeprazole 69m once daily. She does not take Pepcid. She mentions hoarseness of her voice. She has a good appetite and her  weight fluctuates. [place] She denies fevers, skin rashes, oral sores, jaundice, icterus, dark urine, acholic stools,     She has chronic joint pain and vision changes related to optic neuritis.She is taking Melocixam a few times a week. She will occasioanlly take advil PM. She drinks a bottle of wine weekly. She smokes 1/2 ppd x 30 years. Negative family history.    Allergies/Sensitivities:  Allergies   Allergen Reactions    Latex      Created by Conversion - 0;     Amoxicillin     Benadryl [Diphenhydramine] Hives    Codeine      Created by Conversion - 0;     Hydromorphone      Created by Conversion - 0;     Morphine Nausea And Vomiting     shakiness    Vicodin [Hydrocodone-Acetaminophen]     Prednisone Hives and Other (See Comments)     Poor absorption d/t subtotal colectomy, "jittery", upset stomach.    Sulfa Antibiotics Other (See Comments)     Patient cannot recall reaction.       Medications:   Prior to Admission medications    Medication Sig Start Date End Date Taking? Authorizing Provider   meloxicam (MOBIC) 15 MG tablet Take 15 mg by mouth daily   Take with food.    [provider]   famotidine (PEPCID)  40 MG tablet Take 0.5 tablets (20 mg total) by mouth 2 times daily 11/30/15 12/30/15  Dorna Mai, PA   tamsulosin [facility]) 0.4 MG capsule Take 1 capsule (0.4 mg total) by mouth every evening 10/23/15   Arnell Sieving, MD   nortriptyline (PAMELOR) 25 MG capsule Take 1 capsule (25 mg total) by mouth nightly 10/23/15   Arnell Sieving, MD   IV Catheter MISC Insert 22g peripheral IV and leave in place. Saline lock for duration of infusion and discontinue upon completion of infusion course. 10/10/15   Arnell Sieving, MD   Loperamide HCl (IMODIUM PO) Take 1 tablet by mouth    [provider]   diazepam (VALIUM) 5 MG tablet PO. Take 1 tablet 30 minutes prior to MRI. OK to repeat just prior to MRI. MDD '10mg'$ . Do not drive after  MRI. 10/09/15   Arnell Sieving, MD   omeprazole (PRILOSEC) 20 MG capsule Take 20 mg by mouth daily (before breakfast)    [provider]       Past Medical Hx:   Past Medical History:   Diagnosis Date    Crohn's disease     Fibromyalgia     Generalized headaches     Multiple sclerosis        Past Surgical Hx:   Past Surgical History:   Procedure Laterality Date    APPENDECTOMY      BACK SURGERY      spinal fusion - lower    cesarian      COLONOSCOPY      SUBTOTAL COLECTOMY      UMBILICAL HERNIA REPAIR         Social Hx:   Social History   Substance Use Topics    Smoking status: Current Every Day Smoker     Packs/day: 0.25     Types: Cigarettes    Smokeless tobacco: Never Used    Alcohol use Not on file       Family Hx: History reviewed. No pertinent family history of GI malignancy, IBD, liver disease, pancreatic disease, or celiac disease.    Family History   Problem Relation Age of Onset    Cancer Mother     Cancer Father     Bleeding prob Sister     Alcohol abuse Brother     Rheum arthritis Maternal Grandmother     Rheum arthritis Paternal Grandmother          ROS:   General:  No malaise, fatigue, fever, chills, sweats.  HEENT:  No hearing loss, visual changes.  Cardiovascular:  No chest pain, palpitations.  Respiratory:  No dyspnea, cough.  Musculoskeletal:  No myalgias.  GI:  See above.  GU:  No dysuria, hematuria.  Skin:  No rash, pruritus, jaundice.  Neuro:   No focal numbness, weakness, tremor.  Psychiatric:  No confusion, depression.  Endocrine:  No heat or cold intolerance.  Heme/Lymph:  No easy bruising/bleeding.  No concerning lumps.  Allergy/Rheum:  No arthralgias/arthritis.  No rash/hives.     Vitals:   Vitals:    07/01/16 1002   BP: 134/81   Pulse: 73   Weight: 80.3 kg (177 lb 1.6 oz)   Height: 162.6 cm ('5\' 4"'$ )      Body mass index is 30.4 kg/(m^2).    Physical Exam:   General: Appears to be in no acute distress  Skin:  No rashes, jaundice.  Warm and dry.   Lymphatics:   No peripheral  adenopathy palpated in SCF or cervical chains.    HEENT:  No icterus.  No oropharyngeal abnormalities.    Neck:  No masses or tracheal deviation.  Supple.  Lungs:  Clear bilaterally to auscultation. Normal respiratory effort.    Cor:  RRR without murmur.    Abdomen:  Normal bowel sounds, no bruits. Mild diffuse tenderness, non-distended. No hepatosplenomegaly, masses, hernias, or fluid wave/shifting dullness. Left sided tenderness.  Extremities:  Warm, no edema.  Neuro:  Alert and oriented x 3 with appropriate affect.  Ambulatory.  No gross motor deficits noted.      Labs/Imaging:    Ref. Range 03/16/2015 07:44 10/09/2015 09:56   Sodium Latest Ref Range: 133 - 145 mmol/L 136 140   Potassium Latest Ref Range: 3.3 - 5.1 mmol/L 3.9 4.7   Chloride Latest Ref Range: 96 - 108 mmol/L 102 102   CO2 Latest Ref Range: 20 - 28 mmol/L 23 24   Anion Gap Latest Ref Range: 7 - _0 UN Latest Ref Range: 6 - 20 mg/dL 6 9   Creatinine Latest Ref Range: 0.51 - 0.95 mg/dL 0.74 0.79   GFR,Black Latest Units: * 113 104   GFR,Caucasian Latest Units: * 98 90   Glucose Latest Ref Range: 60 - 99 mg/dL 101 (H) 87   Calcium Latest Ref Range: 8.6 - 10.2 mg/dL 9.7 9.1   Magnesium Latest Ref Range: 1.3 - 2.1 mEq/L 1.8    Total Protein Latest Ref Range: 6.3 - 7.7 g/dL  7.1   Albumin Latest Ref Range: 3.5 - 5.2 g/dL  4.6   ALT Latest Ref Range: 0 - 35 U/L  16   AST Latest Ref Range: 0 - 35 U/L  20   Alk Phos Latest Ref Range: 35 - 105 U/L  107 (H)   Bilirubin,Total Latest Ref Range: 0.0 - 1.2 mg/dL  0.3   Cholesterol Latest Units: mg/dL  211 (!)   Triglycerides Latest Units: mg/dL  287 (!)   HDL Latest Units: mg/dL  47   LDL Calculated Latest Units: mg/dL  107   Non HDL Cholesterol Latest Units: mg/dL  164   Chol/HDL Ratio Unknown  4.5   25-OH VIT D2 Latest Units: ng/mL  <4   25-OH VIT D3 Latest Units: ng/mL  21   25-OH Vit Total Latest Ref Range: 30 - 60 ng/mL  21 (L)   TSH Latest Ref Range: 0.27 - 4.20 uIU/mL  1.97   FSH  Latest Units: mIU/mL  11.9   WBC Latest Ref Range: 4.0 - 10.0 THOU/uL 9.0 11.4 (H)   RBC Latest Ref Range: 3.9 - 5.2 MIL/uL 4.4 4.6   Hemoglobin Latest Ref Range: 11.2 - 15.7 g/dL 14.3 14.8   Hematocrit Latest Ref Range: 34 - 45 % 42 45   MCV Latest Ref Range: 79 - 95 fL 95 97 (H)   MCH Latest Ref Range: 26 - 32 pg/cell 32 32   MCHC Latest Ref Range: 32 - 36 g/dL 34 33   RDW Latest Ref Range: 11.7 - 14.4 % 12.6 12.4   Platelets Latest Ref Range: 160 - 370 THOU/uL 248 302   Neut # K/uL Latest Ref Range: 1.6 - 6.1 THOU/uL 7.2 (H) 7.8 (H)   Lymph # K/uL Latest Ref Range: 1.2 - 3.7 THOU/uL 0.9 (L) 2.4   Mono # K/uL Latest Ref Range: 0.2 - 0.9 THOU/uL 0.7 0.7   Eos # K/uL Latest Ref Range: 0.0 - 0.4  THOU/uL 0.1 0.4   Baso # K/uL Latest Ref Range: 0.0 - 0.1 THOU/uL 0.0 0.0   IMM Granulocytes # Latest Ref Range: 0.0 - 0.1 THOU/uL  0.1   Nucl RBC # K/uL Latest Ref Range: 0.0 - 0.0 THOU/uL  0.0   Seg Neut % Latest Units: % 80.4 68.2   Lymphocyte % Latest Units: % 10.1 21.2   Monocyte % Latest Units: % 8.0 6.3   Eosinophil % Latest Units: % 1.4 3.4   Basophil % Latest Units: % 0.1 0.4   IMM Granulocytes Latest Units: %  0.5   Nucl RBC % Latest Ref Range: 0.0 - 0.2 /100 WBC  0.0   Interp,LMH Unknown  see message   T LYM %(CD3) Latest Ref Range: 54 - 87 %  83   T LYM #(CD3) Latest Ref Range: 754 - 2810 cells/uL  2145   CD4% Latest Ref Range: 32 - 71 %  64   CD4# Latest Ref Range: 496 - 2186 cells/uL  1658   T Suppress %(CD8) Latest Ref Range: 10 - 38 %  19   CD8# Latest Ref Range: 177 - 1137 cells/uL  496   B LYM %(CD19) Latest Ref Range: 7 - 36 %  10   B LYM #(CD19) Latest Ref Range: 120 - 725 cells/uL  250   CD4/CD8 Latest Ref Range: 0.7 - 3.0   3.3 (H)   NK LYM %(CD16+56) Latest Ref Range: 4 - 26 %  6   NK LYM #(CD16+56) Latest Ref Range: 37 - 758 cells/uL  152   T LYM% (CD3+4+8+) Latest Units: %  1   T LYM# (CD3+4+8+) Latest Units: cells/uL  33   T LYM% (CD3+4-8-) Latest Units: %  1   T LYM# (CD3+4-8-) Latest Units:  cells/uL  23   NK LYM% (CD3+16+56+) Latest Units: %  3   NK LYM# (CD3+16+56+) Latest Units: cells/uL  66   Tetanus Ab Latest Units: IU/mL  2.6   VZV IgG Unknown  POSITIVE   Hepatitis A IGG Unknown  POS   HBV S Ag Unknown  NEG   HBV S Ab Unknown  NEG   HBV S Ab Quant Latest Units: mIU/mL  0.25   HBV Core Ab Unknown  NEG   HBV Interp Unknown  see below   Hep C Ab Unknown  NEG   TB Ag T-Cell Stimulation Unknown  .   Stratify JCV AB with reflex to inhibition Unknown  POSITIVE   STJIX - Index Value Unknown  0.93         CT Abdomen and Pelvis 07/2013  CT ABDOMEN FINDINGS.THE LUNG BASES ARE UNREMARKABLE.    THE LIVER, GALLBLADDER, PANCREAS AND SPLEEN ARE UNREMARKABLE. THE ADRENAL  GLANDS ARE NORMAL. THE KIDNEYS ARE UNREMARKABLE. THE ABDOMINAL AORTA IS NORMAL  IN APPEARANCE.    THE PATIENT IS STATUS POST SUBTOTAL COLECTOMY WITH AN ANASTOMOSIS SEEN IN THE  RIGHT MIDABDOMEN. THE ANTERIOR ASPECT OF THE ANASTOMOSIS SHOWS INCOMPLETE  DISTENTION WITH ORAL CONTRAST SEEN BEST ON SERIES 3, IMAGE 234. NO DILATED LOOP  OF SMALL BOWEL IDENTIFIED. NO ABSCESS COLLECTION, FREE AIR OR ASCITES NOTED.    CT PELVIS FINDINGS.NO PELVIC FLUID NO BOWEL OBSTRUCTION OR MASS NOTED. THE  UTERUS AND ADNEXA ARE UNREMARKABLE. THE URINARY BLADDER IS UNREMARKABLE. LOWER  LUMBAR FUSION HARDWARE AT L4-L5 NOTED.    CT    IMPRESSION:  1. STATUS POST SUBTOTAL COLECTOMY. THE ANTERIOR ASPECT OF THE ANASTOMOSIS IN  THE RIGHT MIDABDOMEN  SHOWS INCOMPLETE DISTENTION WITH ORAL CONTRAST. THIS MAY  BE POSITIONAL IN NATURE. MASS LESION OR WALL THICKENING AT THE LEVEL OF THE  ANASTOMOSIS COULD HAVE A SIMILAR APPEARANCE. FURTHER EVALUATION WITH SMALL  BOWEL FOLLOW-THROUGH OR COLONOSCOPY IS RECOMMENDED AS CLINICALLY INDICATED.  2. NO EVIDENCE OF SMALL BOWEL OBSTRUCTION.  3. NO FREE AIR OR ASCITES NOTED.  4. NO ABSCESS COLLECTION IDENTIFIED.    Impression(s)/Recommendation(s):   Grace [name] is a 45 year old female with a history of MS, s/p subtotal colectomy in  ~2005 for ? cecal volvulus vs colonic inertia, who presents today for evaluation of Crohn's disease dx in 2005. Unfortunately we have no records to review which made decision making today difficult.  She requires GI evaluation prior to initiating Rituximab for treatment of MS.  She has not been followed for her Crohn's in over 10 years despite ongoing symptoms of chronic diarrhea, bowel urgency, and hematochezia.  Bleeding has worsened over the past few months.  Without records, difficult to determine whether she truly has Crohn's disease. She will require imaging and endoscopy to evaluate for this. Her diarrhea could be due to subtotal colectomy itself. She does describe uncontrolled GERD symptoms with acid regurgitation, pyrosis despite being on Omeprazole 20 mg once daily.     PLAN  - Blood work to check CBC, CMP, CRP, Vitamin D, Iron, Vitamin b12  - Upper endoscopy and colonoscopy w/ miralax preparation.   - Will order a CTE to evaluate for small bowel inflammation.  - Recommend imodium 4 times daily or as needed for diarrhea.   - Recommended she increase her Omeprazole to 40 mg daily .  - She does describe some voice hoarseness and in the setting of smoking history, we do recommend she be evaluated by ENT.     She is moving to [place] at the end of August and therefore we will try to expedite her work up.      We thank you for allowing [place] to participate in this patient's care. The patient was seen, evaluated, and plan was determined by Dr. Purvis Kilts.  Please do not hesistate to contact us with any questions or concerns at [phone].    Raylene Everts, NP-C    GI ATTENDING    I saw, examined and evaluated the patient, reviewed the patient chart and the relevant images and lab findings.   Grace [name] is a previous pt of Dr Kandis Fantasia and Dr Rosebud Poles. She underwent subtotal colectomy for what she describes as "colon twisting on itself" and issues with constipation and abd pain. She notes she had a barium  burger test prior that showed that she needed the surgery. Soon thereafter (she thinks) she was diagnosed with Crohn's disease.  She has not been on any treatment for Crohn's. She has had years of diarrhea since the surgery. She has not followed with any GI doctor. She has severe pyrosis daily despite omeprazole daily.   She is moving to [place] in August and is having surgery on her food on 7/28.   She is a very poor historian and we have no records. They are unavailable as prior GI has closed his practice.   She has multiple sclerosis and is supposed to start Rituxan, but neurology doctor wants her Crohn's evaluated first.    On my exam I find:  General: well appearing WF in no acute distress. Hoarse voice  Skin:  No rashes, jaundice, spider angiomata, palmar erythema, or telangiectasias.  Warm and dry.  Lymphatics:  No peripheral adenopathy palpated in SCF or cervical chains.    HEENT:   no glossitis, cheilosis, or icterus.  No oropharyngeal abnormalities.    Neck:  No masses or tracheal deviation.  Supple.  Lungs:  Clear bilaterally to auscultation. Normal respiratory effort.    Cor:  RRR without murmur, rub, or gallop.  No heave or thrill.    Abdomen:  Normal bowel sounds, no bruits.  Non-tender, non-distended. No hepatosplenomegaly, masses, hernias, or fluid wave/shifting dullness.    Extremities:  Warm, no edema.  Neuro:  Alert and oriented x 3 with appropriate affect.  Ambulatory.  No gross motor deficits noted.      Unclear if she even has Crohn's disease. Diarrhea could be related to surgery.   Plan of care is to perform EGD/colonoscopy and a CTE . Increase omeprazole to 22m daily  ENT referral for hoarse voice in setting of tobacco use  immodium QID      DSheppard Coil MD  Assistant Professor of [place] Attending Physician, Division of Gastroenterology and Hepatology, [id number] [phone]

## 2016-07-03 NOTE — Telephone Encounter (Signed)
Note not signed to complete authorization.

## 2016-07-03 NOTE — Telephone Encounter (Signed)
Spoke with patient, answered NO to all prescreen questions. Mailed Miralax prep. Advised patient would be called back regarding CT scan.     Patient scheduled for 07/22/2016 at 12:15 PM at [facility]

## 2016-07-06 MED ORDER — OMEPRAZOLE 40 MG PO CPDR *I*
40.0000 mg | DELAYED_RELEASE_CAPSULE | Freq: Every day | ORAL | 3 refills | Status: DC
Start: 2016-07-06 — End: 2016-09-23

## 2016-07-08 NOTE — Telephone Encounter (Signed)
Submitted prior authorization.

## 2016-07-10 ENCOUNTER — Encounter: Payer: Self-pay | Admitting: Nurse Practitioner

## 2016-07-10 NOTE — Anesthesia Preprocedure Evaluation (Deleted)
Anesthesia Pre-operative History and Physical for Grace [name]    ______________________________________________________________________________________  CPM Assessment Not Completed  <URMCANSURGSITE>  ROS/MED HX Not Completed  Physical Exam Not Completed________________________________________________________________________  Ermalinda Memos Plan  Anesthesia Consent Not Performed

## 2016-07-11 NOTE — Telephone Encounter (Signed)
CT scan approved [id number] Exp 08/24/16 non site specific.     Called patient connected to Radiology.

## 2016-07-15 ENCOUNTER — Ambulatory Visit
Admission: RE | Admit: 2016-07-15 | Discharge: 2016-07-15 | Disposition: A | Discharge status: Home / Self Care | Payer: Self-pay | Source: Ambulatory Visit | Attending: Gastroenterology | Admitting: Gastroenterology

## 2016-07-15 ENCOUNTER — Other Ambulatory Visit
Admission: RE | Admit: 2016-07-15 | Discharge: 2016-07-15 | Disposition: A | Discharge status: Home / Self Care | Payer: Self-pay | Source: Ambulatory Visit

## 2016-07-15 ENCOUNTER — Encounter: Payer: Self-pay | Admitting: Gastroenterology

## 2016-07-15 DIAGNOSIS — G35 Multiple sclerosis: Secondary | ICD-10-CM

## 2016-07-15 DIAGNOSIS — K5090 Crohn's disease, unspecified, without complications: Secondary | ICD-10-CM

## 2016-07-15 LAB — CBC AND DIFFERENTIAL
Baso # K/uL: 0 10*3/uL (ref 0.0–0.1)
Basophil %: 0.4 %
Eos # K/uL: 0.6 10*3/uL — ABNORMAL HIGH (ref 0.0–0.4)
Eosinophil %: 6.2 %
Hematocrit: 46 % — ABNORMAL HIGH (ref 34–45)
Hemoglobin: 15 g/dL (ref 11.2–15.7)
Lymph # K/uL: 2 10*3/uL (ref 1.2–3.7)
Lymphocyte %: 20.6 %
MCH: 32 pg/cell (ref 26–32)
MCHC: 33 g/dL (ref 32–36)
MCV: 97 fL — ABNORMAL HIGH (ref 79–95)
Mono # K/uL: 0.6 10*3/uL (ref 0.2–0.9)
Monocyte %: 6.5 %
Neut # K/uL: 6.6 10*3/uL — ABNORMAL HIGH (ref 1.6–6.1)
RBC: 4.7 MIL/uL (ref 3.9–5.2)
RDW: 13.1 % (ref 11.7–14.4)
Seg Neut %: 66.3 %
WBC: 9.9 10*3/uL (ref 4.0–10.0)

## 2016-07-15 LAB — COMPREHENSIVE METABOLIC PANEL
Albumin: 4.7 g/dL (ref 3.5–5.2)
Alk Phos: 119 U/L — ABNORMAL HIGH (ref 35–105)
Anion Gap: 11 (ref 7–16)
Bilirubin,Total: 0.3 mg/dL (ref 0.0–1.2)
CO2: 22 mmol/L (ref 20–28)
Calcium: 9.7 mg/dL (ref 8.6–10.2)
Chloride: 106 mmol/L (ref 96–108)
Creatinine: 0.81 mg/dL (ref 0.51–0.95)
GFR,Black: 100 *
GFR,Caucasian: 87 *
Glucose: 70 mg/dL (ref 60–99)
Lab: 11 mg/dL (ref 6–20)
Sodium: 139 mmol/L (ref 133–145)
Total Protein: 7.9 g/dL — ABNORMAL HIGH (ref 6.3–7.7)

## 2016-07-15 LAB — CRP: CRP: 11 mg/L — ABNORMAL HIGH (ref 0–10)

## 2016-07-15 LAB — VITAMIN B12: Vitamin B12: 740 pg/mL (ref 211–946)

## 2016-07-15 LAB — TIBC
Iron: 116 ug/dL (ref 34–165)
TIBC: 387 ug/dL (ref 250–450)
Transferrin Saturation: 30 % (ref 15–50)

## 2016-07-15 LAB — FERRITIN: Ferritin: 66 ng/mL (ref 10–120)

## 2016-07-15 LAB — MULTIPLE ORDERING DOCS

## 2016-07-15 MED ORDER — BARIUM SULFATE (VOLUMEN) 0.1 % PO SUSP *I*
900.0000 mL | Freq: Once | ORAL | Status: AC
Start: 2016-07-15 — End: 2016-07-15
  Administered 2016-07-15: 900 mL via ORAL

## 2016-07-15 MED ORDER — IOHEXOL 350 MG/ML (OMNIPAQUE) IV SOLN *I*
1.0000 mL | Freq: Once | INTRAVENOUS | Status: AC
Start: 2016-07-15 — End: 2016-07-15
  Administered 2016-07-15: 117 mL via INTRAVENOUS

## 2016-07-16 ENCOUNTER — Telehealth: Payer: Self-pay | Admitting: Gastroenterology

## 2016-07-16 LAB — TRANSFERRIN: Transferrin: 346 mg/dL (ref 200–360)

## 2016-07-16 LAB — RESOLUTION

## 2016-07-16 NOTE — Telephone Encounter (Signed)
Ronalee Belts is callign from [facility] to state that this patient had their labs drawn yesterday. He is calling to state the potassium and the AST were hemolyzed . Thank you

## 2016-07-17 ENCOUNTER — Encounter
Admission: RE | Disposition: A | Discharge status: Home / Self Care | Payer: Self-pay | Source: Ambulatory Visit | Attending: Podiatry

## 2016-07-17 ENCOUNTER — Ambulatory Visit
Admission: RE | Admit: 2016-07-17 | Discharge: 2016-07-17 | Disposition: A | Discharge status: Home / Self Care | Payer: Self-pay | Source: Ambulatory Visit | Attending: Podiatry | Admitting: Podiatry

## 2016-07-17 ENCOUNTER — Encounter: Payer: Self-pay | Admitting: Podiatry

## 2016-07-17 HISTORY — PX: PR REMOVAL DEEP IMPLANT: 20680

## 2016-07-17 HISTORY — PX: PR REMOVAL IMPLANT DEEP: 20680

## 2016-07-17 LAB — VITAMIN D
25-OH VIT D2: <4 ng/mL
25-OH VIT D3: 21 ng/mL
25-OH Vit Total: 21 ng/mL — ABNORMAL LOW (ref 30–60)

## 2016-07-17 LAB — PNEUMOCOCCAL IGG AB
Pneumo Sero 1 IgG: 0.75 ug/mL
Pneumo Sero 12F IgG: 0.77 ug/mL
Pneumo Sero 14 IgG: 2.91 ug/mL
Pneumo Sero 18C IgG: 0.89 ug/mL
Pneumo Sero 19F IgG: 2.44 ug/mL
Pneumo Sero 23F IgG: 0.51 ug/mL
Pneumo Sero 3 IgG: 0.51 ug/mL
Pneumo Sero 4 IgG: 0.41 ug/mL
Pneumo Sero 6B IgG: 0.56 ug/mL
Pneumo Sero 7F IgG: 1.1 ug/mL
Pneumo Sero 8 IgG: 1.13 ug/mL
Pneumo Sero 9N IgG: 0.95 ug/mL
Pneumo Sero 9V IgG: 0.9 ug/mL
Pneumo Sero5 IgG: 2.31 ug/mL

## 2016-07-17 SURGERY — REMOVAL, HARDWARE, FOOT
Anesthesia: Local | Site: Foot | Laterality: Left | Wound class: Clean

## 2016-07-17 MED ORDER — SODIUM CHLORIDE 0.9 % INJ (FLUSH) WRAPPED *I*
Status: AC
Start: 2016-07-17 — End: 2016-07-17
  Filled 2016-07-17: qty 20

## 2016-07-17 MED ORDER — SODIUM CHLORIDE 0.9 % IV SOLN WRAPPED *I*
INTRAMUSCULAR | Status: DC | PRN
Start: 2016-07-17 — End: 2016-07-17
  Administered 2016-07-17: 500 mL

## 2016-07-17 MED ORDER — LIDOCAINE HCL 2 % (PF) IJ SOLN *I*
INTRAMUSCULAR | Status: DC | PRN
Start: 2016-07-17 — End: 2016-07-17
  Administered 2016-07-17: 10 mL via SUBCUTANEOUS

## 2016-07-17 SURGICAL SUPPLY — 26 items
ADHESIVE MASTISOL 2/3CC VIAL (Dressing) IMPLANT
BANDAGE ESMARK 4IN LF STER USE 219460 (Dressing) ×2 IMPLANT
BANDAGE ROLL KERLIX 4.5IN X 4.1YD (Dressing) ×2 IMPLANT
BUR SIDE CUT 2.1MM (Other) ×1
BUR SUR L54.5MM HD L11.1MM DIA2.1MM 3 FLUT SS TAPR SIDE CUT SHANNON STYL FOR MIC DRL SYS (Other) ×1 IMPLANT
CLOSURE STERI-STRIP REINF .5 X 4IN LF (Dressing) IMPLANT
COVER C ARM W104XL213CM PNL 76X61CM FTSWCH W36XL76CM MINI FOR OEC 6800 (Drape) ×1 IMPLANT
CUFF TOURNIQUET SPSB PLC 18IN STER DISP (Supply) ×2 IMPLANT
DRAPE MINI C-ARM (Drape) ×1
DRESSING CURITY NONADHERE 3X3 (Dressing) ×2 IMPLANT
GLOVE SURG PROTEXIS PI CLASSIC 7.5 PF SYN (Glove) ×8 IMPLANT
GLOVE SURG PROTEXIS SYN SZ 7.5 BLUE PF LF (Glove) ×2 IMPLANT
GOWN SIRIUS RAGLAN NONREINFORCED XL (Gown) ×2 IMPLANT
LINER SUCT CANISTER SEMI RIGID 1000CC (Supply) ×2 IMPLANT
NEEDLE HYPO BVL LF 25G X 1.5IN (Needle) ×4 IMPLANT
NEEDLE HYPO REG BVL 18G X 1.5IN PINK (Needle) ×6 IMPLANT
PACK CUSTOM MINOR FOOT ANKLE (Pack) ×2 IMPLANT
PADDING WEBRIL 4IN LF NONSTER (Dressing) ×2 IMPLANT
SLEEVE COMP KNEE HI MED (Supply) IMPLANT
SOL SOD CHL IRRIG 500ML BTL (Solution) ×2 IMPLANT
SOLIDIFIER LIQUILOC 1500CC (Supply) ×2 IMPLANT
SPONGE GZE 4X4 STER 10IN S 12 (Dressing) ×2 IMPLANT
STOCKINETTE TBLR 6IN X 48IN STER (Supply) ×2 IMPLANT
SUTR PROLENE MONO 4-0 PS-2 BLUE (Suture) ×2 IMPLANT
SUTURE VCRL + SZ 4-0 L18IN ABSRB UD PS-2 L19MM PRIM REV CUT NDL POLYGLACTIN 910 COAT (Suture) ×2 IMPLANT
SYRINGE 10CC LUERLOCK LF USE 221755 (Supply) ×4 IMPLANT

## 2016-07-17 NOTE — Provider Consult (Signed)
Patient was seen today for surgical excision of screw left foot. The patient has been told of the risks, including but not excluding pain, infection, reoccurrence of the deformity, stiffness of the joint, numbness and or tingling, scar tissue and the need for possible future surgery. All questions were asked and answered. Consents were obtained.H&P was reviewed. The foot was marked.

## 2016-07-17 NOTE — Discharge Instructions (Addendum)
DISCHARGE INSTRUCTIONS  [facility]  Dr. Arnold Long  Dr. High  Dr. Magnus Ivan  Dr. Sheran Luz           [facility]   [address] [place], [place]  [address]    Weightbearing on operative leg:     []  none at all  []  heel only  []  as tolerated   [x]  full weight bearing  []  partial weight bearing  []  remain non weight bearing with crutches       Post Operative Instructions:     [x]  Limit all walking and standing  [x]  Use surgical shoe as directed  [x]  Check capillary return to all digits of operative foot every hour while awake  [x]  Keep limb elevated on 2-3 pillows so that it is above the level of your heart for 48 hours  [x]  Use an ice bag on affected limb for 20 minutes on, then 10 minutes off. Put behind knee if cast has been applied    DIET:    Resume your usual diet as tolerated    MEDICATIONS:    Have prescription filled immediately  Take medications as directed  Resume your usual medications    DRESSINGS:    [x]  Keep your dressing clean and dry until you see your doctor  [x]  Do not remove soiled or wet bandage    YOU SHOULD CALL YOUR DOCTOR at [phone] FOR ANY OF THE FOLLOWING:    Fever of 101 or higher  Redness, warmth and firmness around incision or red streaks up the leg  Foul smelling drainage from incision or cast  Severe pain or throbbing that does not lessen with pain medication as prescribed by your doctor  Persistent nausea or vomiting into the next day  Excessive bleeding through the bandage  Increased numbness or tingling  Pale, blue or cold toes/nail beds (compared to opposite side)  Delayed or no capillary return to digits of operative foot  Your bandage has fallen off    AFTER HOURS CALL: [phone]    Follow-up care:      [x]  Your appointment is scheduled for __7/28/17______ at _2:00pm________am/pm.    []  If not already scheduled or for any questions, call [phone]   []  If not already scheduled or for any questions, call [phone]  []  If not already  scheduled or for any questions, call [phone]  []  If not already scheduled or for any questions, call [phone]

## 2016-07-17 NOTE — INTERIM OP NOTE (Signed)
Interim Op Note (Surgical Log ID: [id number])       Date of Surgery: 07/17/2016       Surgeons: Surgeon(s) and Role:     Burnett Sheng, DPM - Primary       Pre-op Diagnosis: Pre-Op Diagnosis Codes:     * Fixation hardware in foot [Z96.7]       Post-op Diagnosis: Post-Op Diagnosis Codes:     * Fixation hardware in foot [Z96.7]       Procedure(s) Performed: Procedure:    FOOT HARDWARE REMOVAL - #5 metatarsal   CPT(R) Code:  28675 - PR REMOVAL DEEP IMPLANT         Additional CPT Codes:        Anesthesia Type: Anesthesia type not filed in the log.        Fluid Totals:         Estimated Blood Loss: No Data Recorded       Specimens to Pathology:  * No specimens in log *       Temporary Implants:        Packing:                 Patient Condition: good       Findings (Including unexpected complications): Retained screw #5 metatarsal left foot     Signed:  Burnett Sheng, DPM  on 07/17/2016 at 3:48 PM

## 2016-07-17 NOTE — OR Nursing (Signed)
Removed cannulated screw from patient left foot.

## 2016-07-18 NOTE — Op Note (Signed)
[name], Grace [name]  MR #:  [id number]   ACCOUNT #:  [id number] DOB:  [birth year]    AGE:  45     SURGEON:  Burnett Sheng, DPM  CO-SURGEON:    ASSISTANT:    SURGERY DATE:  07/17/2016    PREOPERATIVE DIAGNOSIS:  Retained painful internal fixation screw 5th metatarsal left foot.    POSTOPERATIVE DIAGNOSIS:  Retained painful internal fixation screw 5th metatarsal left foot.    OPERATIVE PROCEDURE:  Excision of internal fixation screw left foot.    ANESTHESIA:  Local utilizing a total of 10 cc of 1:1 mixture of 0.5% plain Marcaine and 2% plain Xylocaine.    HEMOSTASIS:  Via ankle tourniquet, 250 mmHg.    ESTIMATED BLOOD LOSS:  Minimal.    DESCRIPTION OF PROCEDURE:  The patient presents with complaint of a painful internal fixation screw over 5th metatarsal of her left foot from previous tailor's bunionectomy.  Has opted for excision at this time, was brought to the operative suite, placed in the supine position.  Adequate anesthesia was achieved with a local infiltrate of a total of 10 cc of 1:1 mixture of 0.5% plain Marcaine and 2% plain Xylocaine, infiltrated at the base of 5th metatarsal of the left foot.  A well-padded ankle tourniquet was applied to the left ankle.  The left foot was then prepped and draped in the usual sterile fashion.     Excision of internal fixation screw left foot.  A linear incision was placed over the screw over the 5th metatarsal head.  The incision was made using sharp and blunt dissection.  The screw head was freed of all soft tissue and bony attachments.  Attempt was made to remove the screw with the appropriate screwdriver; however, the screw stripped and therefore a Shannon bur was utilized to drill around the head of the screw through the dorsal cortex of the bone and a clamp was utilized to twist and remove the screw in total.  The wound was irrigated with copious amounts of irrigation solution.  Deep structures reapproximated with 4-0 Vicryl in a simple interrupted fashion,  subcutaneous tissue reapproximated with 4-0 Vicryl in a mattress-type fashion, and the skin was reapproximated with 4-0 Prolene in a mattress-type fashion.  The wound was dressed with Adaptic, dry sterile gauze, sterile Kling, and Coban.  The tourniquet was deflated.  Cap return was instantaneous to all digits of the left foot.             ______________________________  Burnett Sheng, DPM    PS/MODL  DD:  07/17/2016 15:53:56  DT:  07/17/2016 18:38:56  Job #:  [id number]    cc:

## 2016-07-19 ENCOUNTER — Telehealth: Payer: Self-pay

## 2016-07-19 ENCOUNTER — Ambulatory Visit: Payer: Self-pay | Admitting: Podiatry

## 2016-07-19 ENCOUNTER — Encounter: Payer: Self-pay | Admitting: Podiatry

## 2016-07-19 VITALS — Ht 64.0 in | Wt 170.0 lb

## 2016-07-19 DIAGNOSIS — Z967 Presence of other bone and tendon implants: Secondary | ICD-10-CM

## 2016-07-19 NOTE — Progress Notes (Signed)
Subjective:     Chief Complaint   Patient presents with    Post Operative Protocol     po1    Doing well after removal of screw left foot    Patient ID: Grace [name] is a 45 y.o. female.    Allergy History as of 07/19/16     CODEINE       Noted Status Severity Type Reaction    04/08/11 1429 Edi Conversion, Allergies 06/04/06 Active       Comments:  Created by Conversion - 0;            HYDROMORPHONE       Noted Status Severity Type Reaction    04/08/11 1429 Edi Conversion, Allergies 06/04/06 Active       Comments:  Created by Conversion - 0;            LATEX       Noted Status Severity Type Reaction    04/08/11 1429 Edi Conversion, Allergies 09/10/06 Active       Comments:  Created by Conversion - 0;            PREDNISONE       Noted Status Severity Type Reaction    11/30/15 1726 Earley Favor, PharmD 07/09/11 Active Low Allergy Hives, Other (See Comments)    Comments:  Poor absorption d/t subtotal colectomy, "jittery", upset stomach.     10/10/15 1009 Merry Lofty, RN 07/09/11 Active Low Allergy Other (See Comments)    Comments:  Poor absorption d/t subtotal colectomy, "jittery", upset stomach.     07/09/11 1324 Hawks, Leeroy Bock 07/09/11 Active  Allergy           AMOXICILLIN       Noted Status Severity Type Reaction    07/17/16 1325 Madelaine Etienne, RN 07/09/11 Active  Allergy Hives    07/09/11 1325 Hawks, Leeroy Bock 07/09/11 Active  Allergy           HYDROCODONE-ACETAMINOPHEN       Noted Status Severity Type Reaction    07/09/11 1325 Hawks, Leeroy Bock 07/09/11 Active  Allergy           MORPHINE       Noted Status Severity Type Reaction    04/06/13 1037 Rayburn Felt, RN 04/06/13 Active  Intolerance Nausea And Vomiting    Comments:  shakiness           DIPHENHYDRAMINE       Noted Status Severity Type Reaction    03/14/15 1107 Yolanda Manges, RN 03/14/15 Active   Hives          SULFA ANTIBIOTICS       Noted Status Severity Type Reaction    07/01/16 1003 Jarold Motto M 07/01/16 Active Low Allergy  Other (See Comments)    Comments:  Patient cannot recall reaction.                 Patient's medications, allergies, past medical, surgical, social and family histories were reviewed and updated as appropriate, there are no changes with the exception of none          Objective:   Physical Exam    The patient appears well and is in no apparent distress, the patient is alert and oriented.  Pedal vascular exam:  Dorsalis pedis pulse 2+ bilaterally. Posterior tibial pulse 2+ bilaterally. Extremities: No edema of the lower limbs bilaterally. No varicosities noted. Capillary refill in the toes bilaterally is < 2 seconds. Gradual temperature decrease bilaterally.  Patient has normal pedal hair growth noted.    The incisions are coapted, the sutures are in place, there is no signs of wound dehiscence, no signs of infection, cellulitis and or abscess formation.  Normal wound appearance for the  first post operative visit.           Assessment:         1. Fixation hardware in foot  * Foot LEFT standard AP, Lateral, Obliqu    * Foot LEFT standard AP, Lateral, Obliqu       Plan:      .DSD applied will continue with surgical shoe will see in 2 weeks

## 2016-07-19 NOTE — Telephone Encounter (Signed)
-----   Message from Wilhemena Durie, MD sent at 07/18/2016 11:21 PM EDT -----  pls let pt know to start vit D 2000 international unit daily  thanks

## 2016-07-19 NOTE — Telephone Encounter (Signed)
Phone message left with instruction to begin Vit D 2000 units daily and to return call noting she received message.

## 2016-07-21 NOTE — Procedures (Signed)
Date: 07/21/16    [place] [facility] [address] [address] [address])  [facility] - [place]  [address] [place] [address]  Dept: [phone]    Number of Views:  Left foot   3 views    Reason for the exam: Post Op    Impression: Post operative removal of screw 5th metatarsal left foot     Burnett Sheng, DPM

## 2016-07-22 ENCOUNTER — Encounter: Payer: Self-pay | Admitting: Gastroenterology

## 2016-07-22 ENCOUNTER — Telehealth: Payer: Self-pay

## 2016-07-22 ENCOUNTER — Ambulatory Visit
Admission: RE | Admit: 2016-07-22 | Discharge: 2016-07-22 | Disposition: A | Discharge status: Home / Self Care | Payer: Self-pay | Attending: Gastroenterology | Admitting: Gastroenterology

## 2016-07-22 HISTORY — DX: Gastro-esophageal reflux disease without esophagitis: K21.9

## 2016-07-22 HISTORY — DX: Unspecified osteoarthritis, unspecified site: M19.90

## 2016-07-22 HISTORY — DX: Myoneural disorder, unspecified: G70.9

## 2016-07-22 MED ORDER — MIDAZOLAM HCL 5 MG/5ML IJ SOLN *I*
INTRAMUSCULAR | Status: AC | PRN
Start: 2016-07-22 — End: 2016-07-22
  Administered 2016-07-22 (×2): 1 mg via INTRAVENOUS
  Administered 2016-07-22 (×2): 2 mg via INTRAVENOUS
  Administered 2016-07-22 (×2): 1 mg via INTRAVENOUS
  Administered 2016-07-22: 2 mg via INTRAVENOUS

## 2016-07-22 MED ORDER — FENTANYL CITRATE 50 MCG/ML IJ SOLN *WRAPPED*
INTRAMUSCULAR | Status: AC
Start: 2016-07-22 — End: 2016-07-22
  Filled 2016-07-22: qty 4

## 2016-07-22 MED ORDER — FENTANYL CITRATE 50 MCG/ML IJ SOLN *WRAPPED*
INTRAMUSCULAR | Status: AC | PRN
Start: 2016-07-22 — End: 2016-07-22
  Administered 2016-07-22 (×4): 25 ug via INTRAVENOUS
  Administered 2016-07-22: 50 ug via INTRAVENOUS
  Administered 2016-07-22: 14:00:00 25 ug via INTRAVENOUS

## 2016-07-22 MED ORDER — MIDAZOLAM HCL 5 MG/5ML IJ SOLN *I*
INTRAMUSCULAR | Status: AC
Start: 2016-07-22 — End: 2016-07-22
  Filled 2016-07-22: qty 10

## 2016-07-22 MED ORDER — SODIUM CHLORIDE 0.9 % IV SOLN WRAPPED *I*
100.0000 mL/h | Status: DC
Start: 2016-07-22 — End: 2016-07-23
  Administered 2016-07-22: 100 mL/h via INTRAVENOUS

## 2016-07-22 NOTE — Procedures (Addendum)
Colonoscopy Procedure Note   Date of Procedure: 07/22/2016   Referring Physician: Thomasene Ripple, MD  Primary Physician: Thomasene Ripple, MD        Attending Physician: Wilhemena Durie, MD  Fellow:   Cristy Hilts, MD  Indications:  Evaluation of Crohn's disease  Previous colonoscopy: Yes -- Date: 2005  Medications: Fentanyl 175 mcg IV and Midazolam 10 mg IV were administered incrementally over the course of the procedure to achieve an adequate level of conscious sedation.    Moderate Sedation Face Times  Start Time: 1317  End Time: 1419        Scopes: [id number]    Procedure Details: Full disclosures of risks were reviewed with patient as detailed on the consent form. The patient was placed in the left lateral decubitus position and monitored with continuous pulse oximetry, interval blood pressure monitoring and direct observations.   After anorectal examination was performed, the colonoscope was inserted into the rectum and advanced under direct vision to the ileo-colonic anastomosis at 30cm and into the ileum for another ~10cm.   The procedure was considered not difficult.  During withdrawal examination, the final quality of the prep was [facility] Bowel Prep Scale:  Left Colon: Grade 3- (entire mucosa of colon segment seen well, with no residual staining, small fragments of stool, or opaque liquid)  A careful inspection was made as the colonoscope was withdrawn. A retroflexed view of the rectum was performed; findings and interventions are described below. The patient was recovered in the GI recovery area.     Scope Times:     Insertion Time: 1400  Cecum Time: 1402 (small bowel anastomsis)  Exit Time: 1417    Findings:    Perianal exam revealed normal tone, no masses and small external anal skin tags. Small bowel to colonic anastomosis at 30cm. Scope introduced another 10cm into ileum. There were 4-5 small aphthous ulcerations in the distal most ileum just proximal to the anastomosis. The remainder of the  small bowel and colonic mucosa appeared normal without any evidence of inflammation.     Intervention(s):      Cytology/Biopsy: Biopsy taken of terminal ileum, of rectosigmoid      Complication(s):     none    Impression(s):     Pt s/p subtotal colectomy with only ~30cm of colon/rectum left.  Several non bleeding aphthous ulcers seen in distal ileum. Biopsies taken. Given her anatomy, 5-8 BMs/day is likely normal for her.       Recommendation(s):     Await pathology.   Follow up in GI Clinic with Wilhemena Durie, MD.    Avoid NSAIDs   Would perform patency capsule followed by [facility] to evaluate for active Crohn's.  However, pt is [place] in near future, so this may need to be done with her new GI doctor.     Cristy Hilts, MD     GI ATTENDING    I was present for the entire viewing portion of the exam and participated as needed throughout the procedure, and I concur with the findings and intervention descriptions as outlined in this edited note.    I provided moderate sedation.  During this time I was face-to-face with the patient and supervising the RN; who monitored the patient's level of consciousness and physiological status.   Wilhemena Durie, MD

## 2016-07-22 NOTE — Telephone Encounter (Signed)
Called pt and gave her recommendation from Dr. Sofie Hartigan to start Vit D 2000 international units daily.

## 2016-07-22 NOTE — Procedures (Addendum)
EGD Procedure Note   Date of Procedure: 07/22/2016    Referring Physician: Thomasene Ripple, MD   Primary Care Provider: Thomasene Ripple, MD   Attending Physician: Wilhemena Durie, MD  Fellow: Cristy Hilts, MD  Indication(s): Dysphagia  Hx of Crohn's disease    Medications: Fentanyl 125 mcg IV and Midazolam 7 mg IV were administered incrementally over the course of the procedure to achieve an adequate level of conscious sedation.   Moderate Sedation Face Times  Start Time: 1317  End Time: 1419        Endoscope: [id number]  Accessories:Biopsy forceps: Regular cold biopsy     Procedure Description: Full disclosure of risks were reviewed with patient as detailed on the consent form. The patient was placed in the left lateral decubitus position and monitored with continuous pulse oximetry, interval blood pressure monitoring and direct observations.   After adequate sedation, the endoscope was carefully introduced into the oropharynx and passed in to the esophagus under direct visualization. The esophagus and GE junction were carefully examined, including a measurement of the Z-line at 36 cm, GEJ at 36 cm and hiatus at 36 cm from the incisors. After advancement of the endoscope into the stomach, a careful examination was performed, including views of the antrum, incisura angularis, corpus and retroflexed views of the cardia and fundus.   The pylorus was then intubated without any difficulty and the endoscope was advanced to the second portion of the duodenum. Careful examination of the second portion of the duodenum and the bulb was then performed.  The stomach was then decompressed and the endoscope withdrawn.   Findings and intervention(s) are detailed below. The patient was recovered in the GI recovery area    Findings:     Esophagus:   Mild linear furrowing throughout esophagus.   Normal z-line  No esophagitis   No strictures/webs/rings   Biopsies taken of mid and distal esophagus      Stomach:   Linear  erythema in antrum   No ulcers/erosions      Duodenum/small bowel:   Normal    Intervention(s):   Cytology/biopsy: Biopsy taken of distal and mid esophagus, stomach and duodenum    Complication(s):   none    Impression(s):    1. Mild linear furrowing throughout esophagus. Biopsies taken of mid and distal esophagus to r/o EoE  2. Gastric erythema, r/o gastritis, r/o Hpylori    Recommendation(s):   Await pathology results   Follow-up in GI clinic with Wilhemena Durie, MD as scheduled      Cristy Hilts, MD       GI ATTENDING    I was present for the entire viewing portion of the exam and participated as needed throughout the procedure, and I concur with the findings and intervention descriptions as outlined in this edited note.    I provided moderate sedation.  During this time I was face-to-face with the patient and supervising the RN; who monitored the patient's level of consciousness and physiological status.   Wilhemena Durie, MD

## 2016-07-22 NOTE — Discharge Instructions (Signed)
Gastroenterology Unit  Discharge Instructions for EGD/Colonoscopy      07/22/2016    2:37 PM    Colonoscopy and Biopsy and EGD with biopsy    Do not drive, operate heavy machinery, drink alcoholic beverages, make important personal or business decisions, or sign legal documents until the next day.      Return to your usual diet   Return to taking your usual medications    Things you may expect:   A small amount of bright red blood in your stool   It may be a few days before you have a bowel movement   You may have cramping, bloating, and feelings of "gas". These feelings should go away as you pass gas. If you still feel uncomfortable, walking around will help to pass the gas.   You were given medication to help you relax during the test. You may feel "fuzzy" and drowsy. Go home and rest for at least 4-6 hours.   Mild sore throat    You should call your doctor for any of the following:   Bad stomach pain   Fever   Bright red bleeding or clots (This may happen up to 20 days after the test.)   Dizziness or weakness that gets worse or lasts up to 24 hours.   Pain or redness at the IV site   Cough, shortness of breath or chest pain.     If you have a serious problem after hours, Call [phone] to reach the GI physician on call. If you are unable to reach your doctor, go to the [facility] Emergency Department.    Follow Up Care:   If biopsies were taken during your procedure, we will send you the pathology results within 7-10 days. If you do not receive your pathology results after 10 days please call [phone]   Report will be sent to your primary doctor.   Follow-up with me in clinic as scheduled    New Prescriptions    No medications on file

## 2016-07-22 NOTE — Preop H&P (Signed)
OUTPATIENT PRE-PROCEDURE H&P    Chief Complaint / Indications for Procedure: Dysphagia, Crohns evaluation    Past Medical History:     Past Medical History:   Diagnosis Date    Arthritis     Crohn's disease     Fibromyalgia     Generalized headaches     GERD (gastroesophageal reflux disease)     Multiple sclerosis     Neuromuscular disorder      Past Surgical History:   Procedure Laterality Date    ABDOMEN SURGERY      APPENDECTOMY      BACK SURGERY      spinal fusion - lower    BUNIONECTOMY Bilateral     cesarian      COLON SURGERY      COLONOSCOPY      HERNIA REPAIR      PR REMOVAL DEEP IMPLANT Left 07/17/2016    Procedure: FOOT HARDWARE REMOVAL - #5 metatarsal ;  Surgeon: Burnett Sheng, DPM;  Location: [facility];  Service: Podiatry    SUBTOTAL COLECTOMY      UMBILICAL HERNIA REPAIR       Family History   Problem Relation Age of Onset    Cancer Mother     Liver cancer Mother     Cancer Father     Bleeding prob Sister     Alcohol abuse Brother     Rheum arthritis Maternal Grandmother     Rheum arthritis Paternal Grandmother     Colon cancer Neg Hx     Colon polyps Neg Hx     Esophageal cancer Neg Hx     Pancreatic Cancer Neg Hx     Rectal cancer Neg Hx     Stomach cancer Neg Hx      Social History     Social History    Marital status: Legally Separated     Spouse name: N/A    Number of children: N/A    Years of education: N/A     Social History Main Topics    Smoking status: Current Every Day Smoker     Packs/day: 0.25     Types: Cigarettes    Smokeless tobacco: Never Used    Alcohol use 1.8 oz/week     3 Cans of beer per week    Drug use: No    Sexual activity: Not Asked     Other Topics Concern    None     Social History Narrative       Allergies:    Allergies   Allergen Reactions    Latex      Created by Conversion - 0;     Amoxicillin Hives    Benadryl [Diphenhydramine] Hives    Codeine      Created by Conversion - 0;     Hydromorphone      Created by Conversion  - 0;     Morphine Nausea And Vomiting     shakiness    Penicillins Nausea And Vomiting    Vicodin [Hydrocodone-Acetaminophen]     Prednisone Hives and Other (See Comments)     Poor absorption d/t subtotal colectomy, "jittery", upset stomach.    Sulfa Antibiotics Other (See Comments)     Patient cannot recall reaction.       Medications:    (Not in a hospital admission)    Current Outpatient Prescriptions   Medication    omeprazole (PRILOSEC) 40 MG capsule    meloxicam (MOBIC) 15 MG  tablet    Loperamide HCl (IMODIUM PO)    famotidine (PEPCID) 40 MG tablet    tamsulosin (FLOMAX) 0.4 MG capsule    nortriptyline (PAMELOR) 25 MG capsule    IV Catheter MISC    diazepam (VALIUM) 5 MG tablet     Current Facility-Administered Medications   Medication Dose Route Frequency    sodium chloride 0.9 % IV  100 mL/hr Intravenous Continuous     Vitals:    07/22/16 1227   BP: 139/88   Pulse: 82   Resp: 18   Temp: 36.9 C (98.4 F)   Weight: 77.1 kg (170 lb)   Height: 162.6 cm (5\' 4" )       ROS    Physical Examination:  Head/Nose/Throat:negative  Lungs:Lungs clear  Cardiovascular:normal S1 and S2  Abdomen: abdomen soft, non-tender, nondistended, normal active bowel sounds, no masses or organomegaly      Lab Results: none    Radiology impressions (last 30 days):  Ct Enterography    Result Date: 07/17/2016  Exam Site: [facility], [place] 07/15/2016 2:29 PM  CT ENTEROGRAPHY  ORDERING CLINICAL INFORMATION:  ERECORD: ABD/PELVIS PAIN, CROHN DISEASE, SUSPECTED OR KNOWN ADDITIONAL CLINICAL INFORMATION:  History of subtotal colectomy in 2005, possibly for cecal volvulus, Crohn's disease not on medication since 2006.  COMPARISON:  CT abdomen pelvis 04/06/2013  PROCEDURE:  Axial CT images were obtained through the abdomen and pelvis following intravenous administration of 117 cc Omnipaque 350 according to the CT enterography protocol. Oral volumen was also administered prior to the examination.  ABDOMEN  FINDINGS:  Lung bases: Normal  Solid Organs/Biliary Tract: The liver, gallbladder, bile ducts, spleen, pancreas, and adrenals are normal. 5 mm nonobstructing calculus in the right kidney upper pole. No hydronephrosis.  GI Tract/Mesentery/Peritoneum: Postsurgical changes of subtotal colectomy with anastomosis in the right lower quadrant. Liquid content within the rectum. There are no dilated or thick-walled bowel loops. No abdominal free fluid or free air. No discrete walled off fluid collections.  Other Findings: The portal vein, SMV and splenic veins are patent. The aorta is nonaneurysmal. Minimal atherosclerosis of the infrarenal aorta. No abdominal lymphadenopathy.  PELVIC FINDINGS:  Organs:  The urinary bladder, uterus and adnexa are normal.  GI Tract/Mesentery/Peritoneum:  As above  Other Findings:  No pelvic lymphadenopathy. No acute osseous or soft tissue abnormalities. Slight decreased size of the soft tissue attenuation material the subcutaneous tissues of the anterior abdomen adjacent to the umbilicus, which likely represents postoperative scarring. No acute osseous or soft tissue abnormalities. Old left eighth lateral rib fracture. Posterior spinal fusion from L4-L5 and prior bone grafting in the right iliac bone.      IMPRESSION:  No CT evidence of inflammatory bowel disease or other acute abnormality.  5 mm nonobstructing calculus in the right kidney upper pole.  END REPORT   I have personally reviewed the image(s) and the resident's interpretation and agree with or edited the findings.      * Foot Left Standard Ap, Lateral, Obliqu    Result Date: 07/21/2016  Burnett Sheng, DPM     07/21/2016  8:58 PM Date: 07/21/16 [place] - [address] ([address]) [facility] - [place] [address] [place] [address] Dept: [phone] Number of Views:  Left foot   3 views Reason for the exam: Post Op Impression: Post operative removal of screw 5th metatarsal left foot Burnett Sheng, DPM       Currently Active  Problems:  Patient Active  Problem List   Diagnosis Code    Multiple Sclerosis G35    Multiple Sclerosis G35    Crohn's Disease K50.90    Nephrolithiasis N20.0    Nicotine Dependence F17.200    Migraine Headache G43.909    Ulcerative Colitis K51.90    Myalgia And Myositis IMO0001    Multiple Sclerosis G35        Impression:  History of inflammatory bowel disease and Upper endoscopy    Plan:  EGD and Colonoscopy  Moderate Sedation    UPDATES TO PATIENT'S CONDITION on the DAY OF SURGERY/PROCEDURE    I. Updates to Patient's Condition (to be completed by a provider privileged to complete a H&P, following reassessment of the patient by the provider):    Full H&P done today; no updates needed.    II. Procedure Readiness   I have reviewed the patient's H&P and updated condition. By completing and signing this form, I attest that this patient is ready for surgery/procedure.      III. Attestation   I have reviewed the updated information regarding the patient's condition and it is appropriate to proceed with the planned surgery/procedure.    Cristy Hilts, MD as of 1:08 PM 07/22/2016

## 2016-07-22 NOTE — Progress Notes (Signed)
Per [employer], no auth required for CPT [id number], #Sam D 4:10pm [id number].

## 2016-07-23 ENCOUNTER — Telehealth: Payer: Self-pay

## 2016-07-23 NOTE — Telephone Encounter (Signed)
Phone call to follow up from procedure. Voice mail left to contact us if having any concerns or issues.

## 2016-07-25 LAB — SURGICAL PATHOLOGY

## 2016-07-26 ENCOUNTER — Ambulatory Visit: Payer: Self-pay | Admitting: Podiatry

## 2016-07-26 ENCOUNTER — Encounter: Payer: Self-pay | Admitting: Podiatry

## 2016-07-26 VITALS — Ht 64.0 in | Wt 170.0 lb

## 2016-07-26 DIAGNOSIS — Z967 Presence of other bone and tendon implants: Secondary | ICD-10-CM

## 2016-07-26 NOTE — Progress Notes (Signed)
Subjective:     Chief Complaint   Patient presents with    Post Operative Protocol     po2 left foot    Doing well s/p removal of screw    Patient ID: Grace [name] is a 45 y.o. female.    Allergy History as of 07/26/16     CODEINE       Noted Status Severity Type Reaction    04/08/11 1429 Edi Conversion, Allergies 06/04/06 Active       Comments:  Created by Conversion - 0;            HYDROMORPHONE       Noted Status Severity Type Reaction    04/08/11 1429 Edi Conversion, Allergies 06/04/06 Active       Comments:  Created by Conversion - 0;            LATEX       Noted Status Severity Type Reaction    04/08/11 1429 Edi Conversion, Allergies 09/10/06 Active       Comments:  Created by Conversion - 0;            PREDNISONE       Noted Status Severity Type Reaction    11/30/15 1726 Earley Favor, PharmD 07/09/11 Active Low Allergy Hives, Other (See Comments)    Comments:  Poor absorption d/t subtotal colectomy, "jittery", upset stomach.     10/10/15 1009 Merry Lofty, RN 07/09/11 Active Low Allergy Other (See Comments)    Comments:  Poor absorption d/t subtotal colectomy, "jittery", upset stomach.     07/09/11 1324 Hawks, Leeroy Bock 07/09/11 Active  Allergy           AMOXICILLIN       Noted Status Severity Type Reaction    07/17/16 1325 Madelaine Etienne, RN 07/09/11 Active  Allergy Hives    07/09/11 1325 Hawks, Leeroy Bock 07/09/11 Active  Allergy           HYDROCODONE-ACETAMINOPHEN       Noted Status Severity Type Reaction    07/09/11 1325 Hawks, Leeroy Bock 07/09/11 Active  Allergy           MORPHINE       Noted Status Severity Type Reaction    04/06/13 1037 Rayburn Felt, RN 04/06/13 Active  Intolerance Nausea And Vomiting    Comments:  shakiness           DIPHENHYDRAMINE       Noted Status Severity Type Reaction    03/14/15 1107 Yolanda Manges, RN 03/14/15 Active   Hives          SULFA ANTIBIOTICS       Noted Status Severity Type Reaction    07/01/16 1003 Jarold Motto M 07/01/16 Active Low Allergy  Other (See Comments)    Comments:  Patient cannot recall reaction.           PENICILLINS       Noted Status Severity Type Reaction    07/22/16 1213 Lorenda Peck, [place] 07/22/16 Active  Allergy Nausea And Vomiting                Patient's medications, allergies, past medical, surgical, social and family histories were reviewed and updated as appropriate, there are no changes with the exception of none          Objective:   Physical Exam    The patient appears well and is in no apparent distress, the patient is alert and oriented.  Pedal vascular  exam:  Dorsalis pedis pulse 2+ bilaterally. Posterior tibial pulse 2+ bilaterally. Extremities: No edema of the lower limbs bilaterally. No varicosities noted. Capillary refill in the toes bilaterally is < 2 seconds. Gradual temperature decrease bilaterally. Patient has normal pedal hair growth noted.   The incisions are coapted, the sutures are in place, there is no signs of wound dehiscence, no signs of infection, cellulitis and or abscess formation.  Normal wound appearance for the  first post operative visit. The wounds have healed to the point that the sutures may be removed.            Assessment:         1. Fixation hardware in foot         Plan:       Doing well, no infection , suture removed, will return to regular shoes and resume showers

## 2016-07-29 ENCOUNTER — Other Ambulatory Visit: Payer: Self-pay

## 2016-07-30 ENCOUNTER — Telehealth: Payer: Self-pay

## 2016-07-30 NOTE — Telephone Encounter (Signed)
Called patient to reschedule Patency Capsule SBI, patient was driving and will call back to reschedule.

## 2016-07-30 NOTE — Telephone Encounter (Signed)
Pt calling and she is in [place] right now and she will call back after Thursday to reschedule

## 2016-07-31 NOTE — Telephone Encounter (Addendum)
-----   Message from Wilhemena Durie, MD sent at 07/22/2016  3:39 PM EDT -----  pls get records from [facility]- Op note and path. It was from 2005-2006.   Thanks    Requested Records from [facility]

## 2016-08-05 ENCOUNTER — Telehealth: Payer: Self-pay | Admitting: Oncology

## 2016-08-05 NOTE — Telephone Encounter (Signed)
-----   Message from Colin Mulders, NP sent at 08/05/2016  8:17 AM EDT -----  Could she be scheduled with Dr. Corey Harold as a follow up.        ----- Message -----     From: Wendall Mola     Sent: 06/18/2016   2:37 PM       To: Colin Mulders, NP    Patient is requesting an appointment from 07/01/16-07/04/16 with Dr. Katrinka Blazing. I am unable to schedule with him due to him leaving. Should i schedule with you or Dr. Corey Harold?

## 2016-08-05 NOTE — Telephone Encounter (Signed)
Left message asking patient to return our call- please schedule follow-up with Dr. Corey Harold

## 2016-08-15 ENCOUNTER — Ambulatory Visit: Payer: Self-pay | Admitting: Otolaryngology

## 2016-08-15 NOTE — Telephone Encounter (Signed)
Patient is scheduled for 9/22 at 1:30 with Dr.Anadani for a FUV.

## 2016-08-22 ENCOUNTER — Ambulatory Visit: Payer: Self-pay | Admitting: Podiatry

## 2016-08-23 ENCOUNTER — Telehealth: Payer: Self-pay | Admitting: Neurology

## 2016-08-23 NOTE — Telephone Encounter (Signed)
I have attempted to contact this patient by phone with the following results: left message to return my call on answering machine.

## 2016-08-23 NOTE — Telephone Encounter (Signed)
Grace [name] is returning a call from [place]. She states this is regarding her results. She is requesting a call back at [phone].

## 2016-08-23 NOTE — Telephone Encounter (Signed)
Patient is calling asking to speak with Dr.Anadani. Patient hasn't seen Dr.Anadani yet this will be her first appointment with her but would like to discuss a few things prior to her appointment. Patient just spoke with Vivi Barrack a few minutes ago. Patient can be reached at [phone].

## 2016-08-23 NOTE — Telephone Encounter (Signed)
Pt was question next appt. She had relocated to [facility]. She is coming back for appts and infusions. She was under the impression that sept appt was for rituxan infusion and appt. She would like to know if rituxan can be scheduled for sept appt.       Last office note states rituxan enrollment completed. She states she conferred with GI.   Do not see authorization or if we processed enrollment forms- this was planned pre ocrevus, would we switch now?    She wishes to pro cede with infusion here because she doesn't want to start over down in [place], after infusion starts she would then like to get our recommendation to transfer to care.

## 2016-08-23 NOTE — Telephone Encounter (Signed)
Called Grace [name] about her Vitamin D level.  She said she restarted Vitamin D3 2000 units, although she doesn't always take it. She is down in [place] at the moment, but is planning on coming back for her appointment.

## 2016-08-28 ENCOUNTER — Telehealth: Payer: Self-pay | Admitting: Neurology

## 2016-08-28 NOTE — Telephone Encounter (Signed)
Spoke to pt who was requesting status of questions regarding Rituxan/Ocrevus.  She was hoping to start infusions in [place] before transferring care to [place] so that she would not have to go through the insurance approval process again in [place].  She is coming for FUV on 9/22 and will be in the area again for a wedding on the weekend of 10/14.    Advised pt that message was sent to Drs. Anadani and Samkoff for advice.  We are awaiting Dr. Marrion Coy recommendations.  However, since neither medication has been approved, and the infusion center is scheduling out one month, it was very unlikely that she would be able to start infusions on same day as 09/13/2016 FUV.  Would clarify with MD which medication she would be starting and may potentially aim for infusion date the weekend of 10/14.  Pt understands she would need to return in two weeks for second infusion of either medication.    Please advise.

## 2016-08-28 NOTE — Telephone Encounter (Signed)
Duplicate encounter.  Plan to close.

## 2016-08-28 NOTE — Telephone Encounter (Signed)
Patient is calling asking to speak with Renae Fickle, Patient states Renae Fickle was looking into something's for the patient on Friday and is returning a call to speak with him. Patient can be reached at [phone].

## 2016-08-28 NOTE — Telephone Encounter (Signed)
We have not seen her since last October.  She was supposed to have GI consult for her Crohn's before starting on rituximab.  I do not know if this was ever done.    She needs f/u before we can make treatment decision.

## 2016-09-06 NOTE — Telephone Encounter (Signed)
Spoke to pt who has moved to [place], [place] [facility] area).  She was trying to start rituxan or ocrevus before changing insurances.  However, she has recently just applied for Medicare/Medicaid in [place].    Pt is wondering if she could be referred to a M.S doctor in her area, and if she should cancel 09/13/2016 appt.  She will still be in the [place] area on that date if appt should not be canceled.    Advised pt that she could visit M.S. Society website which contains a Chiropodist for pts to look for providers near them.  In the meantime writer will see if Dr. Kellie Moor would recommend someone specific.  Will also ask MD if appt should be kept as it may be a few months before pt could be seen in [place].  Please advise.

## 2016-09-06 NOTE — Telephone Encounter (Signed)
Patient is calling to speak with Renae Fickle, he was supposed to be following up with her.

## 2016-09-10 NOTE — Telephone Encounter (Signed)
We can certainly see her if she wishes.  I do not know anyone in [place], [place].  Agree with your suggestion to go to MS society website to search for physician.

## 2016-09-10 NOTE — Telephone Encounter (Signed)
Please have pt contact Health Information Management at [phone] to assist pt with ROI and transfer of records.  Thank you.

## 2016-09-10 NOTE — Telephone Encounter (Signed)
Patient called back. Writer relayed message.Patient canceled her next appointment and will be calling in a few weeks to have her records transferred to a new Neuroimmunologist

## 2016-09-13 ENCOUNTER — Ambulatory Visit: Payer: Self-pay | Admitting: Oncology

## 2016-09-23 ENCOUNTER — Other Ambulatory Visit: Payer: Self-pay | Admitting: Oncology

## 2016-10-02 ENCOUNTER — Ambulatory Visit: Payer: Self-pay | Admitting: Pediatrics

## 2016-10-10 ENCOUNTER — Encounter (INDEPENDENT_AMBULATORY_CARE_PROVIDER_SITE_OTHER): Payer: Self-pay

## 2016-10-10 ENCOUNTER — Ambulatory Visit (INDEPENDENT_AMBULATORY_CARE_PROVIDER_SITE_OTHER): Payer: Medicare Other | Admitting: Pediatrics

## 2016-10-10 ENCOUNTER — Encounter: Payer: Self-pay | Admitting: Pediatrics

## 2016-10-10 VITALS — BP 132/85 | HR 67 | Temp 98.3°F | Ht 64.0 in | Wt 176.4 lb

## 2016-10-10 DIAGNOSIS — K50119 Crohn's disease of large intestine with unspecified complications: Secondary | ICD-10-CM

## 2016-10-10 DIAGNOSIS — E785 Hyperlipidemia, unspecified: Secondary | ICD-10-CM | POA: Diagnosis not present

## 2016-10-10 DIAGNOSIS — F329 Major depressive disorder, single episode, unspecified: Secondary | ICD-10-CM

## 2016-10-10 DIAGNOSIS — Z1231 Encounter for screening mammogram for malignant neoplasm of breast: Secondary | ICD-10-CM | POA: Diagnosis not present

## 2016-10-10 DIAGNOSIS — G35 Multiple sclerosis: Secondary | ICD-10-CM

## 2016-10-10 DIAGNOSIS — K219 Gastro-esophageal reflux disease without esophagitis: Secondary | ICD-10-CM

## 2016-10-10 DIAGNOSIS — D259 Leiomyoma of uterus, unspecified: Secondary | ICD-10-CM

## 2016-10-10 DIAGNOSIS — F32A Depression, unspecified: Secondary | ICD-10-CM | POA: Insufficient documentation

## 2016-10-10 DIAGNOSIS — F410 Panic disorder [episodic paroxysmal anxiety] without agoraphobia: Secondary | ICD-10-CM | POA: Diagnosis not present

## 2016-10-10 DIAGNOSIS — Z1239 Encounter for other screening for malignant neoplasm of breast: Secondary | ICD-10-CM

## 2016-10-10 DIAGNOSIS — K5090 Crohn's disease, unspecified, without complications: Secondary | ICD-10-CM | POA: Insufficient documentation

## 2016-10-10 MED ORDER — LORAZEPAM 0.5 MG PO TABS: 0.5000 mg | ORAL_TABLET | Freq: Two times a day (BID) | ORAL | 1 refills | Status: DC | PRN

## 2016-10-10 MED ORDER — CITALOPRAM HYDROBROMIDE 10 MG PO TABS: 10.0000 mg | ORAL_TABLET | Freq: Every day | ORAL | 2 refills | Status: DC

## 2016-10-10 NOTE — Patient Instructions (Signed)
[facility] Appointment: [phone]  

## 2016-10-10 NOTE — Progress Notes (Signed)
Subjective:   Patient ID: Alyssa Washington, female    DOB: Dec 23, 1971, 45 y.o.   MRN: [id number] CC: New Patient (Initial Visit) follow up multiple problems HPI: Alyssa Washington is a 45 y.o. female presenting for New Patient (Initial Visit)  Recently moved from [place]  H/o MS: Diagnosed in 1999 Was supposed to start a chemo agent but then had the move Last on agent 10 yrs ago H/o optic neuritis About a year ago started having more symptoms, tremors, trouble swallowing. Had multiple new lesions on MRI  GERD: Wakes up feeling nauseous Takes PPI  Anxiety: Out of no where will feel very anxious, rarely takes ativan, less than weekly  Weight loss then gain with medications including prednisone  MGM: died at 55yo of MI Mother had breast cancer  Pap smear: last 2009 Has a U-shaped uterus Followed by gynecology  Crohn's disease: Colonscopy, EGD: last 2017 in [place] No blood in stool Not taking medications daily Partial colectomy  Past Medical History:  Diagnosis Date  . Anxiety   . Arthritis   . Depression   . Fibromyalgia   . GERD (gastroesophageal reflux disease)   . Kidney stones   . MS (multiple sclerosis) (HCC)   . Neuromuscular disorder (HCC)   . Ulcerative colitis (HCC)    Family History  Problem Relation Age of Onset  . Arthritis Mother   . Cancer Mother   . Cancer Father   . COPD Father   . Depression Father   . Hyperlipidemia Sister   . Alcohol abuse Brother   . Drug abuse Brother   . Heart disease Maternal Uncle   . Diabetes Maternal Uncle   . Early death Maternal Uncle   . Depression Paternal Aunt   . Depression Paternal Uncle   . Heart disease Maternal Grandmother   . Early death Maternal Grandmother   . Cancer Paternal Grandmother   . Depression Paternal Grandmother   . Alcohol abuse Paternal Grandfather   . Depression Paternal Grandfather    Social History   Social History  . Marital status: Single    Spouse name: N/A  . Number of  children: N/A  . Years of education: N/A   Social History Main Topics  . Smoking status: Current Every Day Smoker    Packs/day: 0.50    Types: Cigarettes  . Smokeless tobacco: Never Used  . Alcohol use Yes     Comment: Occasional  . Drug use: No  . Sexual activity: No   Other Topics Concern  . None   Social History Narrative  . None   ROS: All systems negative other than what is in HPI  Objective:    BP 132/85   Pulse 67   Temp 98.3 F (36.8 C) (Oral)   Ht 5\' 4"  (1.626 m)   Wt 176 lb 6.4 oz (80 kg)   LMP 10/03/2016 (Approximate)   BMI 30.28 kg/m   Wt Readings from Last 3 Encounters:  10/10/16 176 lb 6.4 oz (80 kg)    Gen: NAD, alert, cooperative with exam, NCAT EYES: EOMI, no conjunctival injection, or no icterus ENT:  TMs pearly gray b/l, OP without erythema LYMPH: no cervical LAD CV: NRRR, normal S1/S2, no murmur, distal pulses 2+ b/l Resp: CTABL, no wheezes, normal WOB Abd: +BS, soft, NTND. no guarding or organomegaly Ext: No edema, warm Neuro: Alert and oriented, walks with cane  Assessment & Plan:  Alyssa Washington was seen today for new patient (initial visit).  Diagnoses and all orders  for this visit:  Multiple sclerosis ([place]) New lesions on MRI in 2016, planning to start new chemo agent in [place] per pt, move delayed start -     CBC with Differential/Platelet -     CMP14+EGFR -     VITAMIN D 25 Hydroxy (Vit-D Deficiency, Fractures) -     Ambulatory referral to Neurology  Depression, unspecified depression type Ongoing symptoms Start citalopram -     citalopram (CELEXA) 10 MG tablet; Take 1 tablet (10 mg total) by mouth daily.  Gastroesophageal reflux disease without esophagitis Symptoms controlled as long as on PPI  Crohn's disease of large intestine with complication ([place]) Recent colonoscopy/EGD No new symptoms now  Hyperlipidemia, unspecified hyperlipidemia type -     Lipid panel  Breast cancer screening -     MM Digital Screening;  Future  Uterine leiomyoma, unspecified location -     Ambulatory referral to Gynecology  Panic attack Starting citalopram as above If needing ativan more often, needs to be seen -     LORazepam (ATIVAN) 0.5 MG tablet; Take 1 tablet (0.5 mg total) by mouth 2 (two) times daily as needed for anxiety.   Follow up plan: Return in about 3 months (around 01/10/2017). Assunta Found, MD [place]

## 2016-10-11 LAB — CMP14+EGFR
A/G RATIO: 1.8 (ref 1.2–2.2)
ALT: 17 IU/L (ref 0–32)
AST: 18 IU/L (ref 0–40)
Albumin: 4.4 g/dL (ref 3.5–5.5)
Alkaline Phosphatase: 90 IU/L (ref 39–117)
BUN/Creatinine Ratio: 18 (ref 9–23)
BUN: 15 mg/dL (ref 6–24)
Bilirubin Total: 0.2 mg/dL (ref 0.0–1.2)
CALCIUM: 9.7 mg/dL (ref 8.7–10.2)
CO2: 27 mmol/L (ref 18–29)
CREATININE: 0.85 mg/dL (ref 0.57–1.00)
Chloride: 101 mmol/L (ref 96–106)
GFR, EST AFRICAN AMERICAN: 95 mL/min/{1.73_m2} (ref >59)
GFR, EST NON AFRICAN AMERICAN: 82 mL/min/{1.73_m2} (ref >59)
Globulin, Total: 2.5 g/dL (ref 1.5–4.5)
Glucose: 78 mg/dL (ref 65–99)
POTASSIUM: 5 mmol/L (ref 3.5–5.2)
Sodium: 141 mmol/L (ref 134–144)
TOTAL PROTEIN: 6.9 g/dL (ref 6.0–8.5)

## 2016-10-11 LAB — LIPID PANEL
CHOL/HDL RATIO: 4.2 ratio (ref 0.0–4.4)
Cholesterol, Total: 212 mg/dL — ABNORMAL HIGH (ref 100–199)
HDL: 50 mg/dL (ref >39)
LDL CALC: 136 mg/dL — AB (ref 0–99)
TRIGLYCERIDES: 131 mg/dL (ref 0–149)
VLDL CHOLESTEROL CAL: 26 mg/dL (ref 5–40)

## 2016-10-11 LAB — CBC WITH DIFFERENTIAL/PLATELET
BASOS: 0 %
Basophils Absolute: 0 10*3/uL (ref 0.0–0.2)
EOS (ABSOLUTE): 0.4 10*3/uL (ref 0.0–0.4)
EOS: 4 %
HEMATOCRIT: 45.7 % (ref 34.0–46.6)
Hemoglobin: 15.1 g/dL (ref 11.1–15.9)
IMMATURE GRANS (ABS): 0 10*3/uL (ref 0.0–0.1)
IMMATURE GRANULOCYTES: 0 %
LYMPHS: 24 %
Lymphocytes Absolute: 2.3 10*3/uL (ref 0.7–3.1)
MCH: 30.8 pg (ref 26.6–33.0)
MCHC: 33 g/dL (ref 31.5–35.7)
MCV: 93 fL (ref 79–97)
Monocytes Absolute: 0.7 10*3/uL (ref 0.1–0.9)
Monocytes: 7 %
NEUTROS PCT: 65 %
Neutrophils Absolute: 6.2 10*3/uL (ref 1.4–7.0)
PLATELETS: 330 10*3/uL (ref 150–379)
RBC: 4.9 x10E6/uL (ref 3.77–5.28)
RDW: 13.6 % (ref 12.3–15.4)
WBC: 9.7 10*3/uL (ref 3.4–10.8)

## 2016-10-11 LAB — VITAMIN D 25 HYDROXY (VIT D DEFICIENCY, FRACTURES): Vit D, 25-Hydroxy: 21.4 ng/mL — ABNORMAL LOW (ref 30.0–100.0)

## 2016-10-28 ENCOUNTER — Encounter: Payer: Self-pay | Admitting: Neurology

## 2016-10-28 ENCOUNTER — Ambulatory Visit (INDEPENDENT_AMBULATORY_CARE_PROVIDER_SITE_OTHER): Payer: Medicare Other | Admitting: Neurology

## 2016-10-28 VITALS — BP 134/85 | HR 69 | Ht 64.0 in | Wt 170.0 lb

## 2016-10-28 DIAGNOSIS — G248 Other dystonia: Secondary | ICD-10-CM | POA: Diagnosis not present

## 2016-10-28 DIAGNOSIS — Z5181 Encounter for therapeutic drug level monitoring: Secondary | ICD-10-CM | POA: Diagnosis not present

## 2016-10-28 DIAGNOSIS — R269 Unspecified abnormalities of gait and mobility: Secondary | ICD-10-CM

## 2016-10-28 DIAGNOSIS — F5104 Psychophysiologic insomnia: Secondary | ICD-10-CM

## 2016-10-28 DIAGNOSIS — G35 Multiple sclerosis: Secondary | ICD-10-CM

## 2016-10-28 DIAGNOSIS — R5382 Chronic fatigue, unspecified: Secondary | ICD-10-CM | POA: Diagnosis not present

## 2016-10-28 HISTORY — DX: Chronic fatigue, unspecified: R53.82

## 2016-10-28 HISTORY — DX: Other dystonia: G24.8

## 2016-10-28 HISTORY — DX: Unspecified abnormalities of gait and mobility: R26.9

## 2016-10-28 MED ORDER — ALPRAZOLAM 0.5 MG PO TABS: ORAL_TABLET | ORAL | 0 refills | Status: DC

## 2016-10-28 MED ORDER — TRAZODONE HCL 50 MG PO TABS: 50.0000 mg | ORAL_TABLET | Freq: Every day | ORAL | 2 refills | Status: DC

## 2016-10-28 NOTE — Progress Notes (Signed)
Reason for visit: Multiple sclerosis  Referring physician: Dr. Soyla Washington is a 45 y.o. female  History of present illness:  Alyssa Washington is a 45 year old right-handed white female who has recently moved from [place] to this area with a history of multiple sclerosis since 1999. The patient indicates that she initially was treated with Copaxone, and was switched Avonex, then back to Copaxone. 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 The patient initially presented with an optic neuritis on the right side. Over the last 10 years, the patient has not been on any disease modifying agents. The patient last had MRI of the brain, cervical spine, and thoracic spine in October 2016. This showed multiple chronic and acute lesions suggesting ongoing disease activity. The patient was to be set up for Rituxan, but this medication was never started. The patient has moved to this area to be near family. The patient is not married, she lives with her 46 year old and 87 year old children. The patient does operate a motor vehicle. She is on disability. 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 The patient has a neurogenic bladder. She walks with a cane, she may fall on occasion. She reports numbness in both feet and some discomfort in both feet. The patient does report some dizziness and some cognitive slowing. She does have some dizziness at times. She has significant fatigue, but she also reports chronic insomnia. The patient comes to this office to make contact with a neurologist for ongoing management of her multiple sclerosis.  Past Medical History:  Diagnosis Date  . Anxiety   . Arthritis   . Depression   . Fibromyalgia   . GERD (gastroesophageal reflux disease)   . Kidney  stones   . Migraine   . MS (multiple sclerosis) ([facility])   . Neuromuscular disorder ([place])   . Ulcerative colitis [facility])     Past Surgical History:  Procedure Laterality Date  . BACK SURGERY  1993  . BUNIONECTOMY  1997  . [place], 2004  . CHOLECYSTECTOMY    . HARDWARE REMOVAL  2017  . HERNIA REPAIR    . INNER EAR SURGERY Right 1981  . LUMBAR FUSION  2012  . SUBTOTAL COLECTOMY  2007    Family History  Problem Relation Age of Onset  . Arthritis Mother   . Cancer Mother   . Cancer Father   . COPD Father   . Depression Father   . Hyperlipidemia Sister   . Alcohol abuse Brother   . Drug abuse Brother   . Heart disease Maternal Uncle   . Diabetes Maternal Uncle   . Early death Maternal Uncle   . Depression Paternal Aunt   . Depression Paternal Uncle   . Heart disease Maternal Grandmother   . Early death Maternal Grandmother   . Cancer Paternal Grandmother   . Depression Paternal Grandmother   . Alcohol abuse Paternal Grandfather   . Depression Paternal Grandfather     Social history:  reports that she has been smoking Cigarettes.  She has been smoking about 0.50 packs per day. She has never used smokeless tobacco. She reports that she drinks alcohol. She reports that she does not use drugs.  Medications:  Prior to Admission medications   Medication Sig Start Date End Date Taking? Authorizing  Provider  citalopram (CELEXA) 10 MG tablet Take 1 tablet (10 mg total) by mouth daily. 10/10/16   Eustaquio Maize, MD  LORazepam (ATIVAN) 0.5 MG tablet Take 1 tablet (0.5 mg total) by mouth 2 (two) times daily as needed for anxiety. 10/10/16   Eustaquio Maize, MD  meloxicam (MOBIC) 15 MG tablet Take 15 mg by mouth daily.    Historical Provider, MD  omeprazole (PRILOSEC) 40 MG capsule Take 40 mg by mouth daily.    Historical Provider, MD      Allergies  Allergen Reactions  . Dilaudid [Hydromorphone Hcl] Anaphylaxis  . Amoxicillin Nausea And Vomiting  . Azithromycin  Nausea And Vomiting  . Benadryl [Diphenhydramine Hcl] Nausea Only    Elevated heart rate   . Charcoal Nausea And Vomiting  . Codeine Nausea And Vomiting  . Erythromycin     Elevated heart rate   . Flomax [Tamsulosin Hcl]   . Nortriptyline     Throat ulcers Weight gain   . Penicillins Nausea And Vomiting  . Prednisone Nausea And Vomiting  . Vicodin [Hydrocodone-Acetaminophen] Nausea And Vomiting  . Latex Hives and Rash    ROS:  Out of a complete 14 system review of symptoms, the patient complains only of the following symptoms, and all other reviewed systems are negative.  Fatigue Itching Blurred vision, loss of vision Joint pain, achy muscles Allergies Memory loss, confusion, numbness, weakness, difficulty swallowing, tremor Anxiety, depression Insomnia, sleepiness, snoring  Blood pressure 134/85, pulse 69, height 5\' 4"  (1.626 m), weight 170 lb (77.1 kg), last menstrual period 10/03/2016.  Physical Exam  General: The patient is alert and cooperative at the time of the examination.  Eyes: Pupils are equal, round, and reactive to light. Discs are flat bilaterally.  Neck: The neck is supple, no carotid bruits are noted.  Respiratory: The respiratory examination is clear.  Cardiovascular: The cardiovascular examination reveals a regular rate and rhythm, no obvious murmurs or rubs are noted.  Skin: Extremities are without significant edema.  Neurologic Exam  Mental status: The patient is alert and oriented x 3 at the time of the examination. The patient has apparent normal recent and remote memory, with an apparently normal attention span and concentration ability.  Cranial nerves: Facial symmetry is present. There is good sensation of the face to pinprick and soft touch on the left, slightly decreased on the right. The strength of the facial muscles and the muscles to head turning and shoulder shrug are normal bilaterally. Speech is well enunciated, no aphasia or  dysarthria is noted. Extraocular movements are full. Visual fields are full. The tongue is midline, and the patient has symmetric elevation of the soft palate. No obvious hearing deficits are noted.  Motor: The motor testing reveals 5 over 5 strength of all 4 extremities, but there is a dystonia involving the right hand with flexion at the wrist and of the third, fourth, and fifth fingers. Good symmetric motor tone is noted throughout.  Sensory: Sensory testing is notable for some decrease in pinprick sensation on the right arm relative to the left, symmetric in the lower extremities. Vibration sensation is decreased in both legs, decreased on the right hand relative to the left. Position sense is intact on all 4 extremities. No evidence of extinction is noted.  Coordination: Cerebellar testing reveals good finger-nose-finger and heel-to-shin bilaterally.  Gait and station: Gait is slightly wide-based, diplegia gait. Tandem gait is unsteady. Romberg is negative.  Reflexes: Deep tendon reflexes are symmetric and  normal bilaterally. Toes are downgoing bilaterally.   Assessment/Plan:  1. Multiple sclerosis  2. Chronic fatigue  3. Gait disorder  4. Chronic insomnia  5. Right upper extremity dystonia  6. Reported cognitive dysfunction  7. Neurogenic bladder  The patient has not been on any medical therapy for greater than 10 years. The patient will be set up for MRI of the brain and cervical spine. She will be considered for treatment with Ocrevus. The patient will have blood work done today. A prescription was given for trazodone for sleep, if this does not improve the fatigue, Provigil may be used in the future. The patient will be sent for a referral for consideration of Botox therapy for the right hand dystonia. In the past, the patient has been on baclofen, she has not continued this medication. She will follow-up in 4 months.  Alyssa Alexanders MD 10/28/2016 8:30 AM  [facility] [address] [place], [place] [address]  Phone [phone] Fax [phone]

## 2016-10-29 LAB — VARICELLA ZOSTER ANTIBODY, IGG: Varicella zoster IgG: 810 index (ref >165)

## 2016-10-29 LAB — HEPATITIS C ANTIBODY

## 2016-10-29 LAB — HEPATITIS B CORE ANTIBODY, TOTAL: HEP B C TOTAL AB: NEGATIVE

## 2016-10-29 LAB — HEPATITIS B SURFACE ANTIGEN: HEP B S AG: NEGATIVE

## 2016-10-30 ENCOUNTER — Telehealth: Payer: Self-pay

## 2016-10-30 ENCOUNTER — Telehealth: Payer: Self-pay | Admitting: Neurology

## 2016-10-30 LAB — QUANTIFERON IN TUBE
QFT TB AG MINUS NIL VALUE: 0 [IU]/mL
QUANTIFERON MITOGEN VALUE: >10 IU/mL
QUANTIFERON NIL VALUE: 0.04 [IU]/mL
QUANTIFERON TB AG VALUE: 0.04 IU/mL
QUANTIFERON TB GOLD: NEGATIVE

## 2016-10-30 LAB — QUANTIFERON TB GOLD ASSAY (BLOOD)

## 2016-10-30 NOTE — Telephone Encounter (Signed)
Error

## 2016-10-30 NOTE — Telephone Encounter (Signed)
-----   Message from Kathrynn Ducking, MD sent at 10/30/2016  7:20 AM EST -----  The blood work results are unremarkable. Please call the patient.  ----- Message ----- From: Lavone Neri Lab Results In Sent: 10/29/2016   7:42 AM To: Kathrynn Ducking, MD

## 2016-10-30 NOTE — Telephone Encounter (Signed)
Patient is aware of results.

## 2016-10-31 ENCOUNTER — Other Ambulatory Visit (HOSPITAL_COMMUNITY)
Admission: RE | Admit: 2016-10-31 | Discharge: 2016-10-31 | Disposition: A | Discharge status: Home / Self Care | Payer: Medicare Other | Source: Ambulatory Visit | Attending: Obstetrics & Gynecology | Admitting: Obstetrics & Gynecology

## 2016-10-31 ENCOUNTER — Ambulatory Visit (INDEPENDENT_AMBULATORY_CARE_PROVIDER_SITE_OTHER): Payer: Medicare Other | Admitting: Obstetrics & Gynecology

## 2016-10-31 ENCOUNTER — Encounter: Payer: Self-pay | Admitting: Obstetrics & Gynecology

## 2016-10-31 VITALS — BP 130/80 | HR 74 | Ht 64.0 in | Wt 173.0 lb

## 2016-10-31 DIAGNOSIS — N946 Dysmenorrhea, unspecified: Secondary | ICD-10-CM | POA: Diagnosis not present

## 2016-10-31 DIAGNOSIS — Z01419 Encounter for gynecological examination (general) (routine) without abnormal findings: Secondary | ICD-10-CM | POA: Insufficient documentation

## 2016-10-31 DIAGNOSIS — N857 Hematometra: Secondary | ICD-10-CM

## 2016-10-31 DIAGNOSIS — Z124 Encounter for screening for malignant neoplasm of cervix: Secondary | ICD-10-CM | POA: Diagnosis not present

## 2016-10-31 DIAGNOSIS — Z1151 Encounter for screening for human papillomavirus (HPV): Secondary | ICD-10-CM | POA: Insufficient documentation

## 2016-10-31 NOTE — Addendum Note (Signed)
Addended by: Diona Fanti A on: 10/31/2016 02:42 PM   Modules accepted: Orders

## 2016-10-31 NOTE — Progress Notes (Signed)
Chief Complaint  Patient presents with  . cramping when on period    Blood pressure 130/80, pulse 74, height 5\' 4"  (1.626 m), weight 173 lb (78.5 kg), last menstrual period 10/03/2016.  45 y.o. No obstetric history on file. Patient's last menstrual period was 10/03/2016 (approximate). The current method of family planning is tubal ligation.  Outpatient Encounter Prescriptions as of 10/31/2016  Medication Sig  . citalopram (CELEXA) 10 MG tablet Take 1 tablet (10 mg total) by mouth daily.  Marland Kitchen LORazepam (ATIVAN) 0.5 MG tablet Take 1 tablet (0.5 mg total) by mouth 2 (two) times daily as needed for anxiety.  . meloxicam (MOBIC) 15 MG tablet Take 15 mg by mouth daily.  Marland Kitchen omeprazole (PRILOSEC) 40 MG capsule Take 40 mg by mouth daily.  Marland Kitchen ALPRAZolam (XANAX) 0.5 MG tablet Take 2 tablets approximately 45 minutes prior to the MRI study, take a third tablet if needed. (Patient not taking: Reported on 10/31/2016)  . [DISCONTINUED] traZODone (DESYREL) 50 MG tablet Take 1 tablet (50 mg total) by mouth at bedtime.   No facility-administered encounter medications on file as of 10/31/2016.     Subjective Pt has always had very heavy periods She had a "period" which started on October 12th and she just stopped spotting the other day Her period before that was in February The bleeding is very light but the pain assoicated is "excruciating" She doesn't even have to bleed to have 3 days of the horrible pain She does not have pain with intercourse but has cramping the next day not nearly as bad as the period associated bleeding  Had endometrial ablation in 2006 or 2007  Objective General WDWN female NAD Vulva:  normal appearing vulva with no masses, tenderness or lesions Vagina:  normal mucosa, no discharge Cervix:  no lesions No CMY Pap is done Uterus:  normal size, contour, position, consistency, mobility, non-tender Adnexa: ovaries:present,  normal adnexa in size, nontender and no masses,  tenderness right sided ovary feels stuck    Pertinent ROS No burning with urination, frequency or urgency No nausea, vomiting or diarrhea Nor fever chills or other constitutional symptoms   Labs or studies     Impression Diagnoses this Encounter::   ICD-9-CM ICD-10-CM   1. Dysmenorrhea 625.3 N94.6 US Pelvis Complete     US Transvaginal Non-OB  2. Hematometra 621.4 N85.7 US Pelvis Complete     US Transvaginal Non-OB   possibly, pending sonogram    Established relevant diagnosis(es): MS  Plan/Recommendations: No orders of the defined types were placed in this encounter.   Labs or Scans Ordered: Orders Placed This Encounter  Procedures  . US Pelvis Complete  . US Transvaginal Non-OB    Management:: Will get a sonogram to evaluate for hematometra or other pathology  Follow up Return in about 1 week (around 11/07/2016) for GYN sono, Follow up, with Dr Elonda Husky.           All questions were answered.  Past Medical History:  Diagnosis Date  . Abnormality of gait 10/28/2016  . Anxiety   . Arthritis   . Chronic fatigue 10/28/2016  . Depression   . Fibromyalgia   . 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 GERD (gastroesophageal reflux disease)   . Kidney stones   . Migraine   . MS (multiple sclerosis) ([place])   . Neuromuscular disorder ([place])   . Ulcerative colitis [facility])     Past Surgical History:  Procedure Laterality Date  .  BACK SURGERY  1993  . BUNIONECTOMY  1997  . [place], 2004  . CHOLECYSTECTOMY    . HARDWARE REMOVAL  2017  . HERNIA REPAIR    . INNER EAR SURGERY Right 1981  . LUMBAR FUSION  2012  . SUBTOTAL COLECTOMY  2007    OB History    No data available      Allergies  Allergen Reactions  . Dilaudid [Hydromorphone Hcl] Anaphylaxis  . Amoxicillin Nausea And Vomiting  . Azithromycin Nausea And Vomiting  . Benadryl [Diphenhydramine Hcl] Nausea Only    Elevated heart rate   . Charcoal Nausea And Vomiting  . Codeine  Nausea And Vomiting  . Erythromycin     Elevated heart rate   . Flomax [Tamsulosin Hcl]   . Nortriptyline     Throat ulcers Weight gain   . Penicillins Nausea And Vomiting  . Prednisone Nausea And Vomiting  . Vicodin [Hydrocodone-Acetaminophen] Nausea And Vomiting  . Latex Hives and Rash    Social History   Social History  . Marital status: Single    Spouse name: N/A  . Number of children: 2  . Years of education: Some college   Occupational History  . N/A    Social History Main Topics  . Smoking status: Current Every Day Smoker    Packs/day: 0.50    Types: Cigarettes  . Smokeless tobacco: Never Used  . Alcohol use Yes     Comment: Occasional  . Drug use: No  . Sexual activity: No   Other Topics Concern  . None   Social History Narrative   Lives at home w/ her children   Right-handed   Caffeine: 3 cups of coffee per day    Family History  Problem Relation Age of Onset  . Arthritis Mother   . Cancer Mother   . Cancer Father   . COPD Father   . Depression Father   . Hyperlipidemia Sister   . Alcohol abuse Brother   . Drug abuse Brother   . Heart disease Maternal Uncle   . Diabetes Maternal Uncle   . Early death Maternal Uncle   . Depression Paternal Aunt   . Depression Paternal Uncle   . Heart disease Maternal Grandmother   . Early death Maternal Grandmother   . Cancer Paternal Grandmother   . Depression Paternal Grandmother   . Alcohol abuse Paternal Grandfather   . Depression Paternal Grandfather

## 2016-11-05 LAB — CYTOLOGY - PAP
DIAGNOSIS: NEGATIVE
HPV (WINDOPATH): NOT DETECTED

## 2016-11-06 ENCOUNTER — Other Ambulatory Visit: Payer: Self-pay | Admitting: Pediatrics

## 2016-11-06 ENCOUNTER — Ambulatory Visit (HOSPITAL_COMMUNITY): Payer: Self-pay

## 2016-11-06 ENCOUNTER — Ambulatory Visit (HOSPITAL_COMMUNITY)
Admission: RE | Admit: 2016-11-06 | Discharge: 2016-11-06 | Disposition: A | Discharge status: Home / Self Care | Payer: Medicare Other | Source: Ambulatory Visit | Attending: Pediatrics | Admitting: Pediatrics

## 2016-11-06 DIAGNOSIS — Z1231 Encounter for screening mammogram for malignant neoplasm of breast: Secondary | ICD-10-CM

## 2016-11-07 ENCOUNTER — Other Ambulatory Visit: Payer: Self-pay | Admitting: Oncology

## 2016-11-08 ENCOUNTER — Ambulatory Visit (INDEPENDENT_AMBULATORY_CARE_PROVIDER_SITE_OTHER): Payer: Medicare Other

## 2016-11-08 ENCOUNTER — Encounter: Payer: Self-pay | Admitting: Obstetrics & Gynecology

## 2016-11-08 ENCOUNTER — Ambulatory Visit (INDEPENDENT_AMBULATORY_CARE_PROVIDER_SITE_OTHER): Payer: Medicare Other | Admitting: Obstetrics & Gynecology

## 2016-11-08 VITALS — BP 152/94 | HR 64 | Ht 64.0 in | Wt 171.0 lb

## 2016-11-08 DIAGNOSIS — N83201 Unspecified ovarian cyst, right side: Secondary | ICD-10-CM

## 2016-11-08 DIAGNOSIS — Q512 Other doubling of uterus: Secondary | ICD-10-CM | POA: Diagnosis not present

## 2016-11-08 DIAGNOSIS — N946 Dysmenorrhea, unspecified: Secondary | ICD-10-CM

## 2016-11-08 DIAGNOSIS — R102 Pelvic and perineal pain: Secondary | ICD-10-CM | POA: Diagnosis not present

## 2016-11-08 DIAGNOSIS — N857 Hematometra: Secondary | ICD-10-CM

## 2016-11-08 MED ORDER — MEGESTROL ACETATE 40 MG PO TABS: 40.0000 mg | ORAL_TABLET | Freq: Every day | ORAL | 11 refills | Status: DC

## 2016-11-08 NOTE — Progress Notes (Signed)
PELVIC US TA/TV: homogeneous anteverted,subseptate uterus,normal left ov,simple right ov cyst 2.4 x 2.1 x 1.8 cm,no free fluid,some right adnexal discomfort,unable to move ovaries w/probe pressure.

## 2016-11-08 NOTE — Progress Notes (Signed)
Follow up appointment for results  Chief Complaint  Patient presents with  . Follow-up    ultrasound    Blood pressure (!) 152/94, pulse 64, height 5\' 4"  (1.626 m), weight 171 lb (77.6 kg), last menstrual period 10/03/2016.  US Transvaginal Non-ob  Result Date: 11/08/2016 GYNECOLOGIC SONOGRAM Alyssa Washington is a 45 y.o. LMP 10/03/2016 for a pelvic sonogram for dysmenorrhea. Uterus                      10 x 5.6 x 3.9 cm,  homogeneous anteverted,subseptate uterus Endometrium          5.2 mm, symmetrical, wnl Right ovary             2.8 x 3.0 x 2.2 cm, simple right ov cyst 2.4 x 2.1 x 1.8 cm Left ovary                2.9 x 2.6 x 1.6 cm, wnl No free fluid,some right adnexal discomfort during ultrasound,unable to move ovaries w/probe pressure. Technician Comments: PELVIC US TA/TV: homogeneous anteverted,subseptate uterus,EEC 5.2 mm,normal left ov,simple right ov cyst 2.4 x 2.1 x 1.8 cm,no free fluid,some right adnexal discomfort during ultrasound,unable to move ovaries w/probe pressure. Alyssa Washington 11/08/2016 11:52 AM Clinical Impression and recommendations: I have reviewed the sonogram results above, combined with the patient's current clinical course, below are my impressions and any appropriate recommendations for management based on the sonographic findings. Normal uterus and endometrium Both ovaries normal, physiologic cyst right ovary Alyssa Washington,Alyssa Washington 11/08/2016 12:07 PM   US Pelvis Complete  Result Date: 11/08/2016 GYNECOLOGIC SONOGRAM Alyssa Washington is a 45 y.o. LMP 10/03/2016 for a pelvic sonogram for dysmenorrhea. Uterus                      10 x 5.6 x 3.9 cm,  homogeneous anteverted,subseptate uterus Endometrium          5.2 mm, symmetrical, wnl Right ovary             2.8 x 3.0 x 2.2 cm, simple right ov cyst 2.4 x 2.1 x 1.8 cm Left ovary                2.9 x 2.6 x 1.6 cm, wnl No free fluid,some right adnexal discomfort during ultrasound,unable to move ovaries w/probe pressure. Technician  Comments: PELVIC US TA/TV: homogeneous anteverted,subseptate uterus,EEC 5.2 mm,normal left ov,simple right ov cyst 2.4 x 2.1 x 1.8 cm,no free fluid,some right adnexal discomfort during ultrasound,unable to move ovaries w/probe pressure. Alyssa Washington 11/08/2016 11:52 AM Clinical Impression and recommendations: I have reviewed the sonogram results above, combined with the patient's current clinical course, below are my impressions and any appropriate recommendations for management based on the sonographic findings. Normal uterus and endometrium Both ovaries normal, physiologic cyst right ovary Alyssa Washington,Alyssa Washington 11/08/2016 12:07 PM      MEDS ordered this encounter: Meds ordered this encounter  Medications  . megestrol (MEGACE) 40 MG tablet    Sig: Take 1 tablet (40 mg total) by mouth daily.    Dispense:  30 tablet    Refill:  11    Orders for this encounter: No orders of the defined types were placed in this encounter.   Impression: Dysmenorrhea  Pelvic pain in female     No hematometra noted on sonogram    Plan: Megestrol course empirically 40 mg daily and see how responds  Follow Up: Return in about 3 months (  around 02/08/2017) for Follow up, with Dr Elonda Husky.       Face to face time:  10 minutes  Greater than 50% of the visit time was spent in counseling and coordination of care with the patient.  The summary and outline of the counseling and care coordination is summarized in the note above.   All questions were answered.  Past Medical History:  Diagnosis Date  . Abnormality of gait 10/28/2016  . Anxiety   . Arthritis   . Chronic fatigue 10/28/2016  . Depression   . Fibromyalgia   . 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 GERD (gastroesophageal reflux disease)   . Kidney stones   . Migraine   . MS (multiple sclerosis) ([place])   . Neuromuscular disorder ([place])   . Ulcerative colitis [facility])     Past Surgical History:  Procedure Laterality Date  . BACK SURGERY  1993   . BUNIONECTOMY  1997  . [place], 2004  . CHOLECYSTECTOMY    . HARDWARE REMOVAL  2017  . HERNIA REPAIR    . INNER EAR SURGERY Right 1981  . LUMBAR FUSION  2012  . SUBTOTAL COLECTOMY  2007    OB History    No data available      Allergies  Allergen Reactions  . Dilaudid [Hydromorphone Hcl] Anaphylaxis  . Amoxicillin Nausea And Vomiting  . Azithromycin Nausea And Vomiting  . Benadryl [Diphenhydramine Hcl] Nausea Only    Elevated heart rate   . Charcoal Nausea And Vomiting  . Codeine Nausea And Vomiting  . Erythromycin     Elevated heart rate   . Flomax [Tamsulosin Hcl]   . Nortriptyline     Throat ulcers Weight gain   . Penicillins Nausea And Vomiting  . Prednisone Nausea And Vomiting  . Vicodin [Hydrocodone-Acetaminophen] Nausea And Vomiting  . Latex Hives and Rash    Social History   Social History  . Marital status: Single    Spouse name: N/A  . Number of children: 2  . Years of education: Some college   Occupational History  . N/A    Social History Main Topics  . Smoking status: Current Every Day Smoker    Packs/day: 0.50    Types: Cigarettes  . Smokeless tobacco: Never Used  . Alcohol use Yes     Comment: Occasional  . Drug use: No  . Sexual activity: No   Other Topics Concern  . None   Social History Narrative   Lives at home w/ her children   Right-handed   Caffeine: 3 cups of coffee per day    Family History  Problem Relation Age of Onset  . Arthritis Mother   . Cancer Mother   . Cancer Father   . COPD Father   . Depression Father   . Hyperlipidemia Sister   . Alcohol abuse Brother   . Drug abuse Brother   . Heart disease Maternal Uncle   . Diabetes Maternal Uncle   . Early death Maternal Uncle   . Depression Paternal Aunt   . Depression Paternal Uncle   . Heart disease Maternal Grandmother   . Early death Maternal Grandmother   . Cancer Paternal Grandmother   . Depression Paternal Grandmother   . Alcohol  abuse Paternal Grandfather   . Depression Paternal Grandfather

## 2016-11-08 NOTE — Telephone Encounter (Signed)
Error

## 2016-11-11 ENCOUNTER — Other Ambulatory Visit: Payer: Self-pay | Admitting: Oncology

## 2016-11-11 ENCOUNTER — Other Ambulatory Visit: Payer: Medicare Other

## 2016-11-11 ENCOUNTER — Ambulatory Visit (HOSPITAL_COMMUNITY)
Admission: RE | Admit: 2016-11-11 | Discharge: 2016-11-11 | Disposition: A | Discharge status: Home / Self Care | Payer: Medicare Other | Source: Ambulatory Visit | Attending: Neurology | Admitting: Neurology

## 2016-11-11 ENCOUNTER — Telehealth: Payer: Self-pay | Admitting: Neurology

## 2016-11-11 DIAGNOSIS — M50222 Other cervical disc displacement at C5-C6 level: Secondary | ICD-10-CM | POA: Diagnosis not present

## 2016-11-11 DIAGNOSIS — M47812 Spondylosis without myelopathy or radiculopathy, cervical region: Secondary | ICD-10-CM | POA: Insufficient documentation

## 2016-11-11 DIAGNOSIS — J328 Other chronic sinusitis: Secondary | ICD-10-CM | POA: Diagnosis not present

## 2016-11-11 DIAGNOSIS — G35 Multiple sclerosis: Secondary | ICD-10-CM

## 2016-11-11 MED ORDER — GADOBENATE DIMEGLUMINE 529 MG/ML IV SOLN
15.0000 mL | Freq: Once | INTRAVENOUS | Status: AC | PRN
  Administered 2016-11-11: 15 mL via INTRAVENOUS

## 2016-11-11 NOTE — Telephone Encounter (Signed)
I called patient. The MRI study showed no enhancing lesions, there appears to be multiple lesions in the spinal cord, mild involvement of the brain.  We are trying to get the patient set up for Ocrevus, the patient will call me in about 10 days if she has not heard anything about this.   MRI brain and cervical 11/11/16:  IMPRESSION: 1. Multiple white matter lesions in periventricular and juxta cortical white matter typical for demyelination and multiple sclerosis. No definite brainstem or cerebellar lesion identified. No abnormal enhancement or diffusion restriction to suggest an active process. 2. Multiple cervical spinal cord white matter lesions predominantly in the posterior column at C4, C5, and C6. No abnormal enhancement to suggest an active process. 3. Mild diffuse paranasal sinus disease. 4. Mild cervical spondylosis greatest at C5-6. No high-grade canal stenosis or foraminal narrowing.

## 2016-11-12 NOTE — Telephone Encounter (Signed)
Pt is asking for a call back to discuss MRI results. Pt said she was really groggy when Dr. Jannifer Franklin called and does not know what was said.

## 2016-11-13 ENCOUNTER — Ambulatory Visit: Payer: Self-pay

## 2016-11-13 ENCOUNTER — Other Ambulatory Visit: Payer: Self-pay

## 2016-11-13 NOTE — Telephone Encounter (Signed)
Returned pt TC and reviewed MRI results. Pt would like to proceed with Ocrevus therapy. Agreed to call back if she does not hear anything by the end of next week.

## 2016-11-13 NOTE — Telephone Encounter (Signed)
The form does not require a patient signature, the form was given to [place].

## 2016-11-15 ENCOUNTER — Telehealth: Payer: Self-pay | Admitting: Pediatrics

## 2016-11-15 MED ORDER — OMEPRAZOLE 40 MG PO CPDR: 40.0000 mg | DELAYED_RELEASE_CAPSULE | Freq: Every day | ORAL | 1 refills | Status: DC

## 2016-11-15 NOTE — Telephone Encounter (Signed)
Patient aware that Mammogram is normal.

## 2016-11-18 ENCOUNTER — Other Ambulatory Visit: Payer: Medicare Other

## 2016-11-18 NOTE — Telephone Encounter (Signed)
Called patient to notify to contact new PCP or GI provider for script per [facility].

## 2016-11-18 NOTE — Telephone Encounter (Signed)
No answer left VM

## 2016-11-20 ENCOUNTER — Other Ambulatory Visit: Payer: Self-pay | Admitting: *Deleted

## 2016-11-20 MED ORDER — OMEPRAZOLE 40 MG PO CPDR: 40.0000 mg | DELAYED_RELEASE_CAPSULE | Freq: Every day | ORAL | 1 refills | Status: DC

## 2016-11-21 ENCOUNTER — Other Ambulatory Visit: Payer: Self-pay

## 2016-11-21 MED ORDER — OMEPRAZOLE 40 MG PO CPDR: 40.0000 mg | DELAYED_RELEASE_CAPSULE | Freq: Every day | ORAL | 1 refills | Status: DC

## 2016-12-03 ENCOUNTER — Other Ambulatory Visit: Payer: Self-pay | Admitting: Pediatrics

## 2016-12-03 NOTE — Telephone Encounter (Signed)
Please review and advise Pt last saw Dr Evette Doffing on 10/10/2016

## 2016-12-03 NOTE — Telephone Encounter (Signed)
Can wait for Dr. Evette Doffing to return

## 2016-12-04 MED ORDER — MELOXICAM 15 MG PO TABS: 15.0000 mg | ORAL_TABLET | Freq: Every day | ORAL | 1 refills | Status: DC

## 2016-12-04 NOTE — Telephone Encounter (Signed)
Attempted to contact patient. No answer. 

## 2016-12-11 IMAGING — MR MR CERVICAL SPINE WO/W CM
4 of 8 series · 15 of 48 positions shown · IV contrast (multihance)
Comparison: None.

CLINICAL DATA: 45 y/o F; history of multiple sclerosis with
confusion, pain, and depression with difficulty walking for 2 years.

EXAM:
MRI HEAD WITHOUT AND WITH CONTRAST
MRI CERVICAL SPINE WITHOUT AND WITH CONTRAST
TECHNIQUE: Multiplanar, multiecho pulse sequences of the brain and surrounding
structures, and cervical spine, to include the craniocervical
junction and cervicothoracic junction, were obtained without and
with intravenous contrast.
CONTRAST:  15mL MULTIHANCE GADOBENATE DIMEGLUMINE 529 MG/ML IV SOLN

[Series 10: T2 · axial · 3.0mm · 0.18mm/px · z∈[-19,+76]mm · 6 of 36 slices shown (1 of 2)]
[im 1/36]
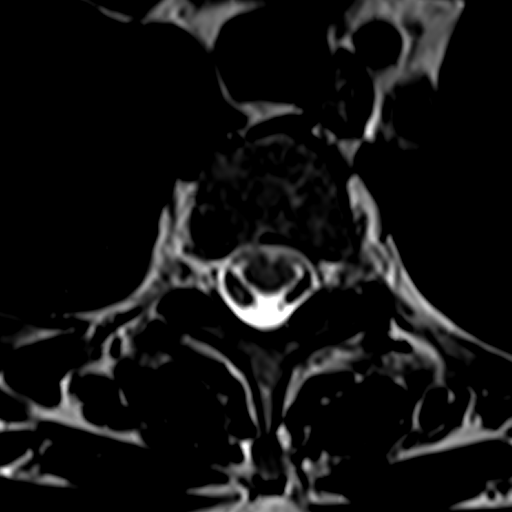
[im 5/36]
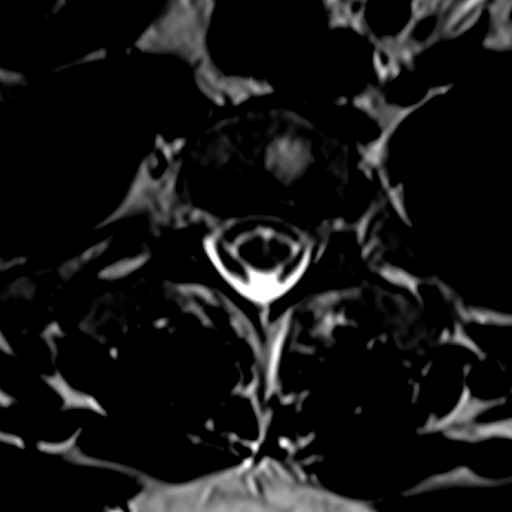
[im 9/36]
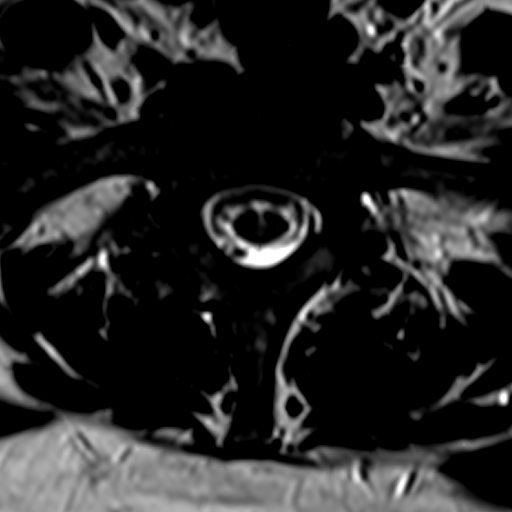
[im 14/36]
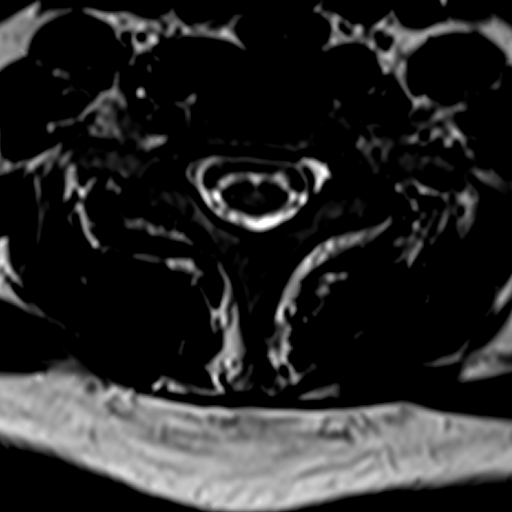
[im 18/36]
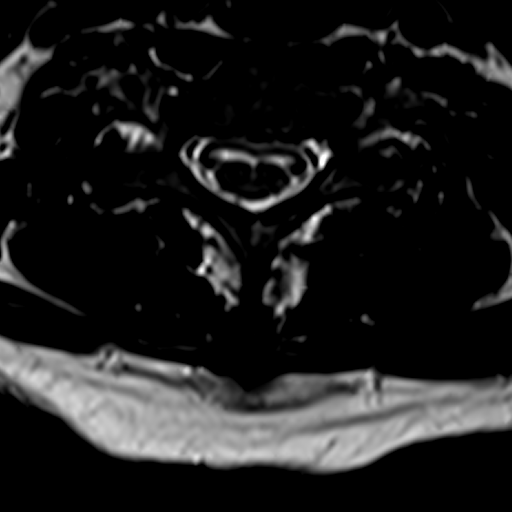
[im 31/36]
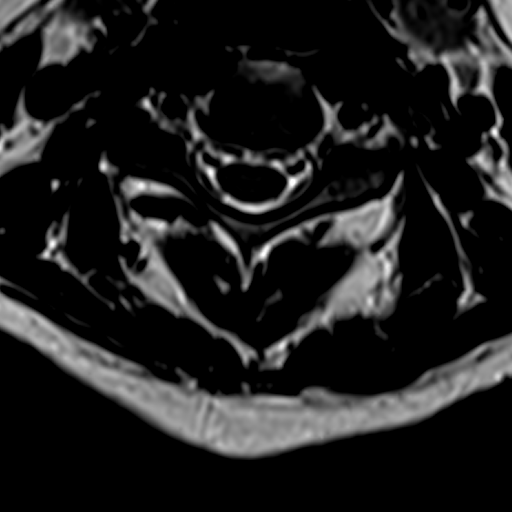

[Series 11: T1 · axial · 3.0mm · 0.14mm/px · z∈[-6,+76]mm · 3 of 36 slices shown (1 of 2)]
[im 5/36]
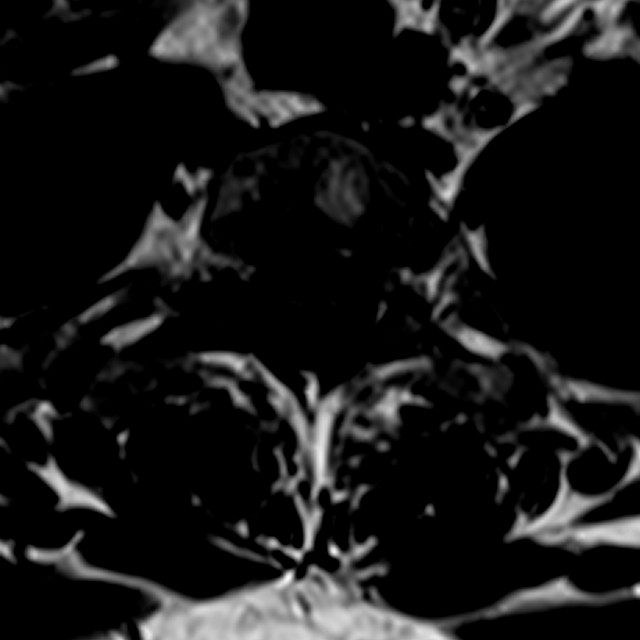
[im 18/36]
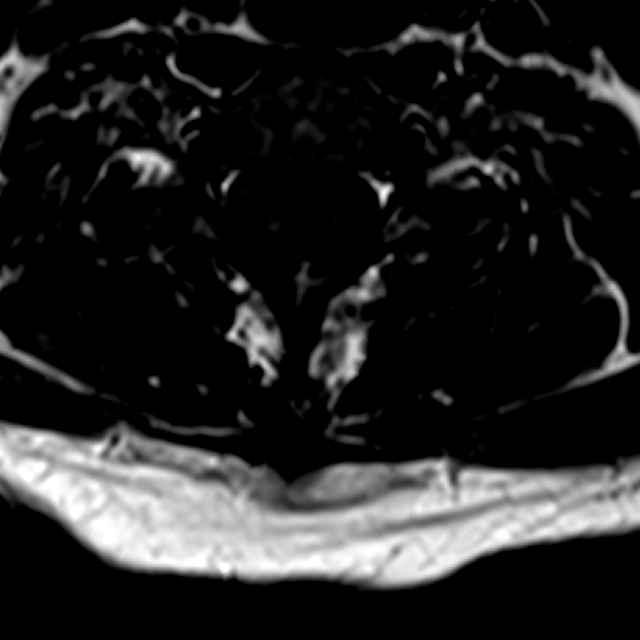
[im 31/36]
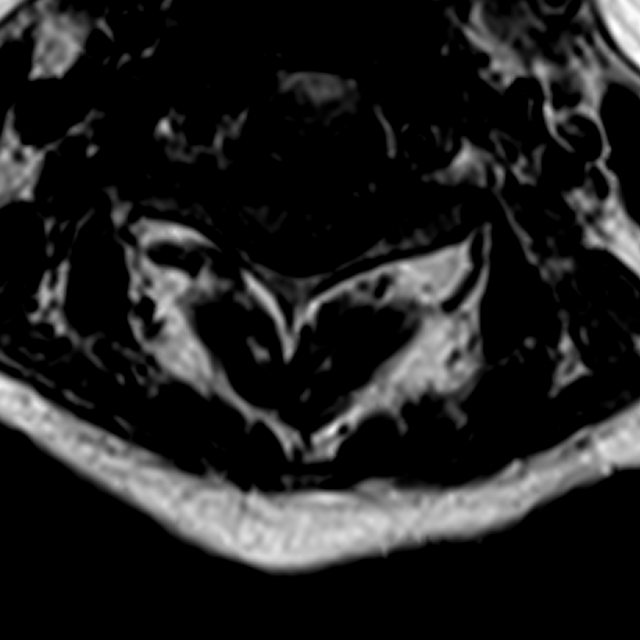

[Series 12: T2 · sagittal · 3.0mm · 0.34mm/px · 3 of 13 slices shown (2 of 2)]
[im 1/13]
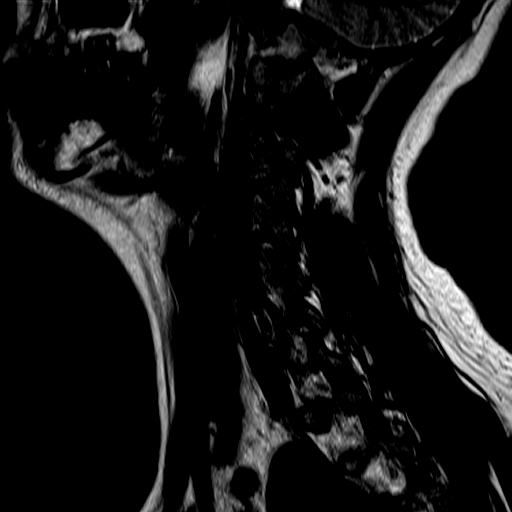
[im 7/13]
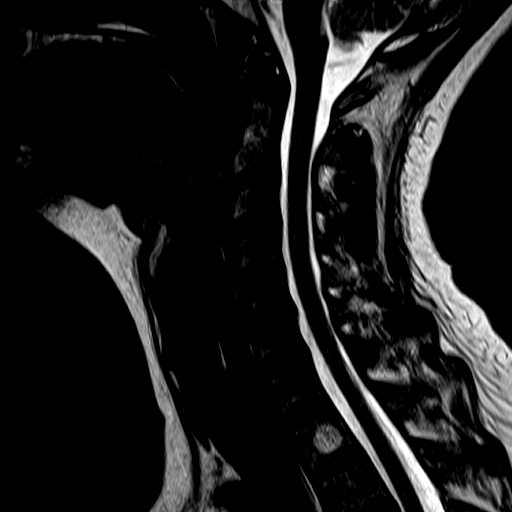
[im 13/13]
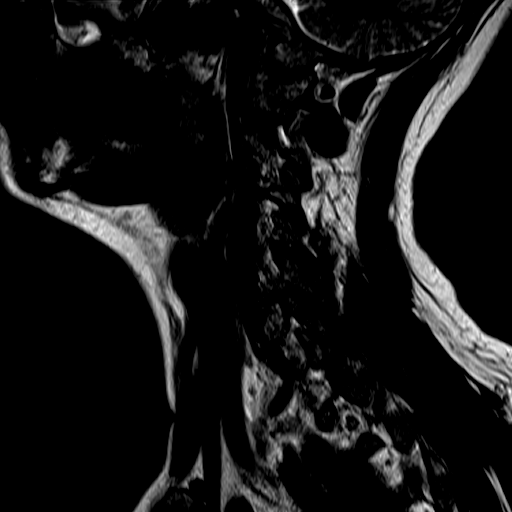

[Series 14: T1 · axial · 3.0mm · 0.13mm/px · z∈[-6,+75]mm · 3 of 36 slices shown (2 of 2)]
[im 5/36]
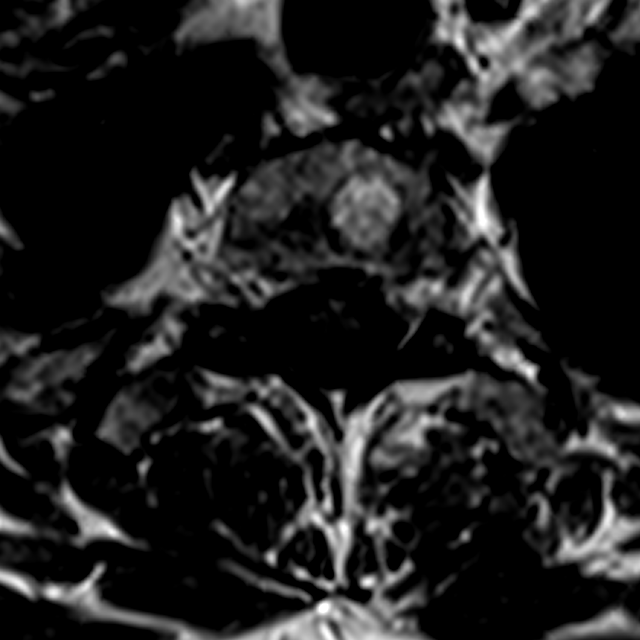
[im 18/36]
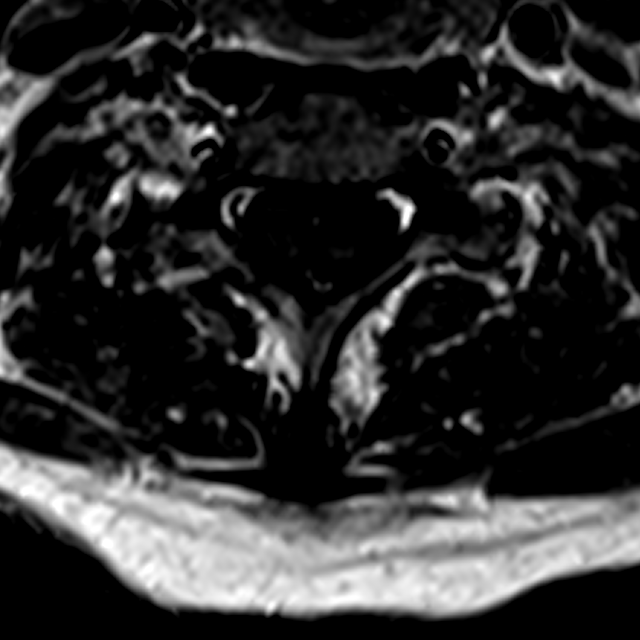
[im 31/36]
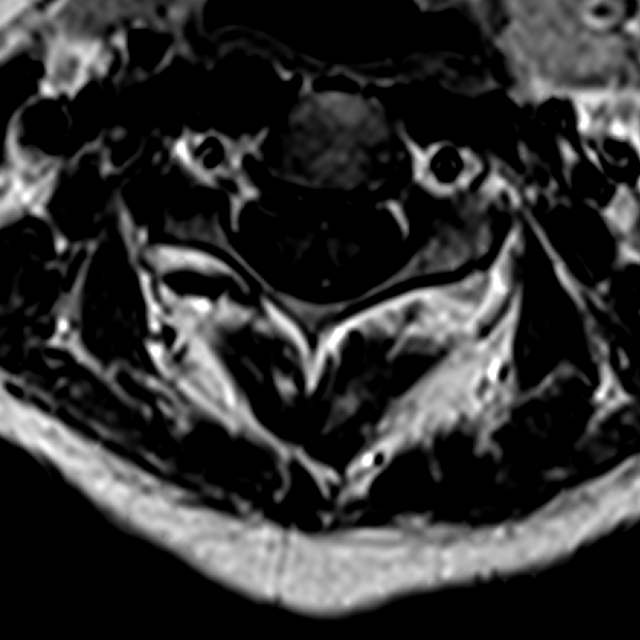

[15 of 48 positions shown; findings below may reference images not displayed]

FINDINGS: MRI HEAD FINDINGS

Brain: No diffusion signal abnormality. Multiple T2 FLAIR
hyperintense lesions are present in white matter in a
periventricular distribution with radial orientation to the
ventricles. There are several foci within the juxta cortical white
matter predominantly in frontal lobes bilaterally. There are lesions
within the anterior body of corpus callosum and diffuse increased
signal of the under belly of corpus callosum. There is a lesion
within the left posterior limb of internal capsule. No definite
abnormal signal is present within the posterior fossa. No abnormal
enhancement is identified.

No abnormal susceptibility hypointensity to indicate intracranial
hemorrhage. No focal mass effect.

Vascular: Normal flow voids.

Skull and upper cervical spine: Normal marrow signal.

Sinuses/Orbits: Mild diffuse paranasal sinus mucosal thickening. No
abnormal signal of mastoid air cells. Orbits are unremarkable.

Other: None.

MRI CERVICAL SPINE FINDINGS

Alignment: Physiologic.

Vertebrae: No fracture, evidence of discitis, or bone lesion. 10 mm
hemangioma within the T1 vertebral body.

Cord: There are several T2 hyperintense cord white matter lesions:
Right posterior column at C4, right posterior column at C5, right
posterior column at C6, and possible lateral column lesions on the
left at C5-6 and on the right at C7.

Posterior Fossa, vertebral arteries, paraspinal tissues: As above.

Disc levels: There are mild degenerative changes of the cervical
spine most pronounced at the C5-6 level with there is a small
central disc protrusion and left-sided uncovertebral hypertrophy
with mild left foraminal narrowing. No high-grade canal stenosis or
foraminal narrowing is present.
IMPRESSION: 1. Multiple white matter lesions in periventricular and juxta
cortical white matter typical for demyelination and multiple
sclerosis. No definite brainstem or cerebellar lesion identified. No
abnormal enhancement or diffusion restriction to suggest an active
process.
2. Multiple cervical spinal cord white matter lesions predominantly
in the posterior column at C4, C5, and C6. No abnormal enhancement
to suggest an active process.
3. Mild diffuse paranasal sinus disease.
4. Mild cervical spondylosis greatest at C5-6. No high-grade canal
stenosis or foraminal narrowing.

By: Maltatabletop Hirgigo M.D.

## 2016-12-25 NOTE — Telephone Encounter (Signed)
The original form was given to [place]. If she does not have it, I will do the form again.

## 2017-01-06 NOTE — Telephone Encounter (Signed)
Pt arrived to office this morning thinking that she was scheduled for infusion. However, benefits have not yet been completed and pt was not on infusion schedule. Pt signed [employer] Forms which were faxed to [phone].

## 2017-01-15 ENCOUNTER — Telehealth: Payer: Self-pay | Admitting: Adult Health

## 2017-01-15 ENCOUNTER — Other Ambulatory Visit: Payer: Self-pay | Admitting: Pediatrics

## 2017-01-15 DIAGNOSIS — F329 Major depressive disorder, single episode, unspecified: Secondary | ICD-10-CM

## 2017-01-15 DIAGNOSIS — F32A Depression, unspecified: Secondary | ICD-10-CM

## 2017-01-15 NOTE — Telephone Encounter (Signed)
error 

## 2017-01-22 ENCOUNTER — Telehealth: Payer: Self-pay

## 2017-01-22 NOTE — Telephone Encounter (Signed)
Received fax from [facility] stating that pt has been approved for [facility] Access. Copy given to Otila Kluver in [employer] for benefits investigation and scheduling.

## 2017-02-05 DIAGNOSIS — G35 Multiple sclerosis: Secondary | ICD-10-CM | POA: Diagnosis not present

## 2017-02-07 ENCOUNTER — Ambulatory Visit: Payer: Medicare Other | Admitting: Pediatrics

## 2017-02-10 ENCOUNTER — Encounter: Payer: Self-pay | Admitting: Pediatrics

## 2017-02-10 ENCOUNTER — Telehealth: Payer: Self-pay | Admitting: Pediatrics

## 2017-02-12 ENCOUNTER — Other Ambulatory Visit: Payer: Self-pay | Admitting: Pediatrics

## 2017-02-19 DIAGNOSIS — G35 Multiple sclerosis: Secondary | ICD-10-CM | POA: Diagnosis not present

## 2017-02-24 ENCOUNTER — Telehealth: Payer: Self-pay | Admitting: Neurology

## 2017-02-24 ENCOUNTER — Encounter: Payer: Self-pay | Admitting: Family Medicine

## 2017-02-24 ENCOUNTER — Ambulatory Visit (INDEPENDENT_AMBULATORY_CARE_PROVIDER_SITE_OTHER): Payer: Medicare Other | Admitting: Family Medicine

## 2017-02-24 VITALS — BP 138/85 | HR 79 | Temp 97.3°F | Ht 64.0 in | Wt 162.0 lb

## 2017-02-24 DIAGNOSIS — F410 Panic disorder [episodic paroxysmal anxiety] without agoraphobia: Secondary | ICD-10-CM | POA: Diagnosis not present

## 2017-02-24 DIAGNOSIS — R030 Elevated blood-pressure reading, without diagnosis of hypertension: Secondary | ICD-10-CM | POA: Diagnosis not present

## 2017-02-24 DIAGNOSIS — T148XXA Other injury of unspecified body region, initial encounter: Secondary | ICD-10-CM | POA: Diagnosis not present

## 2017-02-24 DIAGNOSIS — N63 Unspecified lump in unspecified breast: Secondary | ICD-10-CM | POA: Diagnosis not present

## 2017-02-24 DIAGNOSIS — E559 Vitamin D deficiency, unspecified: Secondary | ICD-10-CM | POA: Diagnosis not present

## 2017-02-24 DIAGNOSIS — R233 Spontaneous ecchymoses: Secondary | ICD-10-CM | POA: Diagnosis not present

## 2017-02-24 DIAGNOSIS — R5383 Other fatigue: Secondary | ICD-10-CM

## 2017-02-24 MED ORDER — LORAZEPAM 0.5 MG PO TABS: 0.5000 mg | ORAL_TABLET | Freq: Two times a day (BID) | ORAL | 0 refills | Status: DC | PRN

## 2017-02-24 MED ORDER — OXYCODONE-ACETAMINOPHEN 10-325 MG PO TABS: 1.0000 | ORAL_TABLET | Freq: Four times a day (QID) | ORAL | 0 refills | Status: DC | PRN

## 2017-02-24 NOTE — Addendum Note (Signed)
Addended by: Margette Fast on: 02/24/2017 03:28 PM   Modules accepted: Orders

## 2017-02-24 NOTE — Telephone Encounter (Signed)
Pt called said she had OCREVUS on 2/28 and woke on Saturday 02/22/17 with upper respiratory issues, cognitive issues, balance is horrible, extremely tired and feels like the bones hurt. Please call

## 2017-02-24 NOTE — Progress Notes (Signed)
BP 138/85   Pulse 79   Temp 97.3 F (36.3 C) (Oral)   Ht _0  (1.626 m)   Wt 162 lb (73.5 kg)   BMI 27.81 kg/m    Subjective:    Patient ID: Alyssa Washington, female    DOB: May 24, 1971, 46 y.o.   MRN: [id number]  HPI: Karuna Balducci is a 46 y.o. female presenting on 02/24/2017 for Hypertension (BP has been elevated) and Lump on right breast   HPI Elevated blood pressure Patient is coming in today for establishing care with our office and for blood pressure check. Her blood pressure today is 138/85. She says she has been to her doctor that manages her multiple sclerosis and her blood pressure has been up in the 160s on a few occasions recently. She does admit that she started a new medication for her multiple sclerosis and does not note that affecting her blood pressure readings. It is an injectable medication that she has started called Ocrevus. She does not know if she is having side effects from that medication or something else is going on but she's also had fatigue and bruising and feelings of having panic or anxiety along with her elevated blood pressure. She denies any chest pain or shortness of breath or wheezing. She denies any palpitations or leg swelling. She also feels like she's been off balance and at times having troubles with her memory and feeling confused because she forgets things or tasks that she was going to do. She says she has felt flushed at times and then she has felt some achiness or pain in her bones. For her panic attack she has previously used Ativan but rarely uses it. She is like a few to help her with this severe times when it builds up.  Breast lump and tenderness Patient is also coming in because she's noticed over the past week that she has a tender lump in her right outer breast and she feels like she has swelling there. She denies any overlying skin changes or discharge or nipple discharge. She denies any fevers or chills. She just had her mammogram  about 3 months ago and it did not show anything but this new lump came up since that time. She has never had any issues like this with her breast before  Relevant past medical, surgical, family and social history reviewed and updated as indicated. Interim medical history since our last visit reviewed. Allergies and medications reviewed and updated.  Review of Systems  Constitutional: Positive for fatigue. Negative for chills and fever.  HENT: Negative for congestion, ear discharge, ear pain, postnasal drip, rhinorrhea, sinus pressure, sneezing and sore throat.   Eyes: Negative for pain, redness and visual disturbance.  Respiratory: Negative for chest tightness and shortness of breath.   Cardiovascular: Negative for chest pain and leg swelling.  Gastrointestinal: Negative for abdominal distention, abdominal pain, constipation, diarrhea, nausea and vomiting.  Genitourinary: Positive for flank pain. Negative for difficulty urinating, dysuria, frequency, hematuria, pelvic pain and urgency.  Musculoskeletal: Positive for arthralgias and myalgias. Negative for back pain and gait problem.  Skin: Negative for color change and rash.  Neurological: Negative for light-headedness and headaches.  Hematological: Bruises/bleeds easily.  Psychiatric/Behavioral: Negative for agitation and behavioral problems.  All other systems reviewed and are negative.   Per HPI unless specifically indicated above   Allergies as of 02/24/2017      Reactions   Dilaudid [hydromorphone Hcl] Anaphylaxis   Amoxicillin Nausea And Vomiting  Azithromycin Nausea And Vomiting   Benadryl [diphenhydramine Hcl] Nausea Only   Elevated heart rate   Charcoal Nausea And Vomiting   Codeine Nausea And Vomiting   Erythromycin    Elevated heart rate   Flomax [tamsulosin Hcl]    Nortriptyline    Throat ulcers Weight gain   Penicillins Nausea And Vomiting   Prednisone Nausea And Vomiting   Vicodin [hydrocodone-acetaminophen]  Nausea And Vomiting   Latex Hives, Rash      Medication List       Accurate as of 02/24/17  9:53 AM. Always use your most recent med list.          citalopram 10 MG tablet Commonly known as:  CELEXA take 1 tablet by mouth once daily   LORazepam 0.5 MG tablet Commonly known as:  ATIVAN Take 1 tablet (0.5 mg total) by mouth 2 (two) times daily as needed for anxiety.   meloxicam 15 MG tablet Commonly known as:  MOBIC take 1 tablet once daily   omeprazole 40 MG capsule Commonly known as:  PRILOSEC Take 1 capsule (40 mg total) by mouth daily.          Objective:    BP 138/85   Pulse 79   Temp 97.3 F (36.3 C) (Oral)   Ht _0  (1.626 m)   Wt 162 lb (73.5 kg)   BMI 27.81 kg/m   Wt Readings from Last 3 Encounters:  02/24/17 162 lb (73.5 kg)  10/31/16 173 lb (78.5 kg)  10/28/16 170 lb (77.1 kg)    Physical Exam  Constitutional: She is oriented to person, place, and time. She appears well-developed and well-nourished. No distress.  Eyes: Conjunctivae are normal.  Cardiovascular: Normal rate, regular rhythm, normal heart sounds and intact distal pulses.   No murmur heard. Pulmonary/Chest: Effort normal and breath sounds normal. No respiratory distress. She has no wheezes. She has no rales. Right breast exhibits mass and tenderness. Right breast exhibits no inverted nipple, no nipple discharge and no skin change. Left breast exhibits no inverted nipple, no mass, no nipple discharge, no skin change and no tenderness. Breasts are symmetrical.    Abdominal: Soft. She exhibits no distension. There is no tenderness. There is no rebound.  Musculoskeletal: Normal range of motion. She exhibits tenderness (Bilateral low back pain). She exhibits no edema.  Neurological: She is alert and oriented to person, place, and time. Coordination normal.  Skin: Skin is warm and dry. Bruising (Small bruises scattered on arms and legs) noted. No rash noted. She is not diaphoretic.    Psychiatric: She has a normal mood and affect. Her behavior is normal.  Nursing note and vitals reviewed.     Assessment & Plan:   Problem List Items Addressed This Visit      Other   Panic attack   Relevant Medications   LORazepam (ATIVAN) 0.5 MG tablet    Other Visit Diagnoses    Breast lump in female    -  Primary   Right breast lateral lower quadrant around 8:00 near [place]   Relevant Medications   doxycycline (VIBRA-TABS) 100 MG tablet   Other Relevant Orders   MM DIAG BREAST TOMO BILATERAL   Bruising       Relevant Orders   CBC with Differential/Platelet (Completed)   CMP14+EGFR (Completed)   TSH (Completed)   VITAMIN D 25 Hydroxy (Vit-D Deficiency, Fractures) (Completed)   Vitamin B12 (Completed)   Elevated blood pressure reading  Patient has had elevated blood pressures while getting her treatments but today it is normal, return in one month for monitoring   Relevant Orders   VITAMIN D 25 Hydroxy (Vit-D Deficiency, Fractures) (Completed)   Vitamin B12 (Completed)   Other fatigue       Relevant Orders   CBC with Differential/Platelet (Completed)   CMP14+EGFR (Completed)   TSH (Completed)   VITAMIN D 25 Hydroxy (Vit-D Deficiency, Fractures) (Completed)   Vitamin B12 (Completed)       Follow up plan: Return in about 4 weeks (around 03/24/2017), or if symptoms worsen or fail to improve, for Blood pressure recheck.  Counseling provided for all of the vaccine components Orders Placed This Encounter  Procedures  . HM MAMMOGRAPHY  . CBC with Differential/Platelet  . CMP14+EGFR  . TSH  . VITAMIN D 25 Hydroxy (Vit-D Deficiency, Fractures)  . Vitamin B12    Caryl Pina, MD [facility] 02/24/2017, 9:53 AM

## 2017-02-24 NOTE — Telephone Encounter (Signed)
Placed printed/signed rx oxycodone-acetaminophen up front for patient pick-up

## 2017-02-24 NOTE — Telephone Encounter (Signed)
I called patient. Several days after the infusion of Ocrevus she began developing signs of an upper respiratory tract infection with a cough and hoarseness of the voice. The patient feels as if she has a lot of bone pain, but she also has developed bruising throughout her body.  She is gone in and seen her primary care physician, she has gotten blood work done today, the results are pending. The patient will be seen later this week in office, I will give her a prescription for oxycodone for pain.  I indicated that she becomes very sick, she is to go to the emergency room for an evaluation.

## 2017-02-25 ENCOUNTER — Other Ambulatory Visit: Payer: Self-pay | Admitting: Family Medicine

## 2017-02-25 DIAGNOSIS — N6310 Unspecified lump in the right breast, unspecified quadrant: Secondary | ICD-10-CM

## 2017-02-25 LAB — CBC WITH DIFFERENTIAL/PLATELET
Basophils Absolute: 0 10*3/uL (ref 0.0–0.2)
Basos: 0 %
EOS (ABSOLUTE): 0.4 10*3/uL (ref 0.0–0.4)
EOS: 3 %
HEMATOCRIT: 44.8 % (ref 34.0–46.6)
HEMOGLOBIN: 15.1 g/dL (ref 11.1–15.9)
IMMATURE GRANS (ABS): 0 10*3/uL (ref 0.0–0.1)
Immature Granulocytes: 0 %
LYMPHS ABS: 2.2 10*3/uL (ref 0.7–3.1)
LYMPHS: 19 %
MCH: 31.7 pg (ref 26.6–33.0)
MCHC: 33.7 g/dL (ref 31.5–35.7)
MCV: 94 fL (ref 79–97)
MONOCYTES: 8 %
Monocytes Absolute: 0.9 10*3/uL (ref 0.1–0.9)
NEUTROS ABS: 8.1 10*3/uL — AB (ref 1.4–7.0)
Neutrophils: 70 %
Platelets: 312 10*3/uL (ref 150–379)
RBC: 4.76 x10E6/uL (ref 3.77–5.28)
RDW: 13.7 % (ref 12.3–15.4)
WBC: 11.6 10*3/uL — ABNORMAL HIGH (ref 3.4–10.8)

## 2017-02-25 LAB — CMP14+EGFR
A/G RATIO: 1.7 (ref 1.2–2.2)
ALBUMIN: 4.2 g/dL (ref 3.5–5.5)
ALK PHOS: 86 IU/L (ref 39–117)
ALT: 21 IU/L (ref 0–32)
AST: 12 IU/L (ref 0–40)
BILIRUBIN TOTAL: 0.3 mg/dL (ref 0.0–1.2)
BUN/Creatinine Ratio: 21 (ref 9–23)
BUN: 20 mg/dL (ref 6–24)
CHLORIDE: 99 mmol/L (ref 96–106)
CO2: 23 mmol/L (ref 18–29)
Calcium: 9 mg/dL (ref 8.7–10.2)
Creatinine, Ser: 0.95 mg/dL (ref 0.57–1.00)
GFR calc non Af Amer: 72 mL/min/{1.73_m2} (ref >59)
GFR, EST AFRICAN AMERICAN: 82 mL/min/{1.73_m2} (ref >59)
GLOBULIN, TOTAL: 2.5 g/dL (ref 1.5–4.5)
GLUCOSE: 97 mg/dL (ref 65–99)
Potassium: 5.1 mmol/L (ref 3.5–5.2)
SODIUM: 138 mmol/L (ref 134–144)
TOTAL PROTEIN: 6.7 g/dL (ref 6.0–8.5)

## 2017-02-25 LAB — VITAMIN B12: Vitamin B-12: 650 pg/mL (ref 232–1245)

## 2017-02-25 LAB — TSH: TSH: 1.58 u[IU]/mL (ref 0.450–4.500)

## 2017-02-25 LAB — VITAMIN D 25 HYDROXY (VIT D DEFICIENCY, FRACTURES): Vit D, 25-Hydroxy: 11.1 ng/mL — ABNORMAL LOW (ref 30.0–100.0)

## 2017-02-25 MED ORDER — DOXYCYCLINE HYCLATE 100 MG PO TABS: 100.0000 mg | ORAL_TABLET | Freq: Two times a day (BID) | ORAL | 0 refills | Status: DC

## 2017-02-28 ENCOUNTER — Ambulatory Visit: Payer: Medicare Other | Admitting: Neurology

## 2017-02-28 ENCOUNTER — Telehealth: Payer: Self-pay | Admitting: Neurology

## 2017-02-28 NOTE — Telephone Encounter (Signed)
This patient did not show for a revisit appointment today. 

## 2017-03-07 ENCOUNTER — Encounter: Payer: Self-pay | Admitting: Neurology

## 2017-03-11 ENCOUNTER — Encounter (HOSPITAL_COMMUNITY): Payer: Medicare Other

## 2017-03-12 ENCOUNTER — Telehealth: Payer: Self-pay | Admitting: Neurology

## 2017-03-12 NOTE — Telephone Encounter (Signed)
Patient called office requesting refill for oxyCODONE-acetaminophen (PERCOCET) 10-325 MG tablet.

## 2017-03-12 NOTE — Telephone Encounter (Signed)
Patient would like prescription mailed

## 2017-03-13 NOTE — Telephone Encounter (Addendum)
Called and spoke to pt. Relayed CW,MD denied refill request. Request too soon. Not due until 03/24/17. She verbalized understanding. She was trying to stay on top of things. Advised since rx controlled substance, we cannot mail. She has to pick up printed rx's if its a controlled substance. Advised her to call when closer to time for refill. She verbalized understanding.

## 2017-03-13 NOTE — Telephone Encounter (Signed)
The patient did not show for appointment on 02/28/2017, just got a prescription on 02/24/2017, next prescription is not due until 03/24/2017.  Prescription refill has been denied at this time.

## 2017-03-20 ENCOUNTER — Encounter (HOSPITAL_COMMUNITY): Payer: Self-pay | Admitting: *Deleted

## 2017-03-25 ENCOUNTER — Ambulatory Visit (HOSPITAL_COMMUNITY)
Admission: RE | Admit: 2017-03-25 | Discharge: 2017-03-25 | Disposition: A | Discharge status: Home / Self Care | Payer: Medicare Other | Source: Ambulatory Visit | Attending: Family Medicine | Admitting: Family Medicine

## 2017-03-25 ENCOUNTER — Encounter (HOSPITAL_COMMUNITY): Payer: Self-pay | Admitting: Radiology

## 2017-03-25 DIAGNOSIS — N6310 Unspecified lump in the right breast, unspecified quadrant: Secondary | ICD-10-CM | POA: Insufficient documentation

## 2017-03-25 DIAGNOSIS — R922 Inconclusive mammogram: Secondary | ICD-10-CM | POA: Diagnosis not present

## 2017-04-02 ENCOUNTER — Telehealth: Payer: Self-pay | Admitting: Neurology

## 2017-04-02 MED ORDER — OXYCODONE-ACETAMINOPHEN 10-325 MG PO TABS: 1.0000 | ORAL_TABLET | Freq: Four times a day (QID) | ORAL | 0 refills | Status: DC | PRN

## 2017-04-02 NOTE — Telephone Encounter (Signed)
Patient called office requesting refill for oxyCODONE-acetaminophen (PERCOCET) 10-325 MG tablet.

## 2017-04-02 NOTE — Addendum Note (Signed)
Addended by: Kathrynn Ducking on: 04/02/2017 05:51 PM   Modules accepted: Orders

## 2017-04-02 NOTE — Telephone Encounter (Signed)
The prescription for oxycodone will be refilled, the [facility] registry was checked.

## 2017-04-03 NOTE — Telephone Encounter (Signed)
Placed printed/signed rx up front for pt pick up. 

## 2017-04-09 ENCOUNTER — Other Ambulatory Visit: Payer: Self-pay | Admitting: Pediatrics

## 2017-04-11 ENCOUNTER — Other Ambulatory Visit: Payer: Self-pay | Admitting: Pediatrics

## 2017-04-11 DIAGNOSIS — F329 Major depressive disorder, single episode, unspecified: Secondary | ICD-10-CM

## 2017-04-11 DIAGNOSIS — F32A Depression, unspecified: Secondary | ICD-10-CM

## 2017-04-26 ENCOUNTER — Emergency Department (HOSPITAL_COMMUNITY)
Admission: EM | Admit: 2017-04-26 | Discharge: 2017-04-26 | Disposition: A | Discharge status: Home / Self Care | Payer: Medicare Other | Attending: Emergency Medicine | Admitting: Emergency Medicine

## 2017-04-26 ENCOUNTER — Emergency Department (HOSPITAL_COMMUNITY): Payer: Medicare Other

## 2017-04-26 ENCOUNTER — Encounter (HOSPITAL_COMMUNITY): Payer: Self-pay | Admitting: Emergency Medicine

## 2017-04-26 DIAGNOSIS — R110 Nausea: Secondary | ICD-10-CM | POA: Diagnosis not present

## 2017-04-26 DIAGNOSIS — R109 Unspecified abdominal pain: Secondary | ICD-10-CM | POA: Diagnosis not present

## 2017-04-26 DIAGNOSIS — R1031 Right lower quadrant pain: Secondary | ICD-10-CM | POA: Insufficient documentation

## 2017-04-26 DIAGNOSIS — F17210 Nicotine dependence, cigarettes, uncomplicated: Secondary | ICD-10-CM | POA: Diagnosis not present

## 2017-04-26 DIAGNOSIS — I714 Abdominal aortic aneurysm, without rupture: Secondary | ICD-10-CM | POA: Diagnosis not present

## 2017-04-26 DIAGNOSIS — Z79899 Other long term (current) drug therapy: Secondary | ICD-10-CM | POA: Diagnosis not present

## 2017-04-26 DIAGNOSIS — R350 Frequency of micturition: Secondary | ICD-10-CM | POA: Insufficient documentation

## 2017-04-26 DIAGNOSIS — N200 Calculus of kidney: Secondary | ICD-10-CM | POA: Diagnosis not present

## 2017-04-26 LAB — URINALYSIS, ROUTINE W REFLEX MICROSCOPIC
Bilirubin Urine: NEGATIVE
Glucose, UA: NEGATIVE mg/dL
HGB URINE DIPSTICK: NEGATIVE
Ketones, ur: NEGATIVE mg/dL
Leukocytes, UA: NEGATIVE
NITRITE: NEGATIVE
PH: 5 (ref 5.0–8.0)
Protein, ur: NEGATIVE mg/dL
SPECIFIC GRAVITY, URINE: 1.009 (ref 1.005–1.030)

## 2017-04-26 LAB — WET PREP, GENITAL
SPERM: NONE SEEN
TRICH WET PREP: NONE SEEN
YEAST WET PREP: NONE SEEN

## 2017-04-26 LAB — BASIC METABOLIC PANEL
Anion gap: 5 (ref 5–15)
BUN: 16 mg/dL (ref 6–20)
CHLORIDE: 107 mmol/L (ref 101–111)
CO2: 25 mmol/L (ref 22–32)
Calcium: 8.6 mg/dL — ABNORMAL LOW (ref 8.9–10.3)
Creatinine, Ser: 0.83 mg/dL (ref 0.44–1.00)
GFR calc Af Amer: >60 mL/min (ref >60)
Glucose, Bld: 104 mg/dL — ABNORMAL HIGH (ref 65–99)
POTASSIUM: 3.5 mmol/L (ref 3.5–5.1)
SODIUM: 137 mmol/L (ref 135–145)

## 2017-04-26 LAB — CBC WITH DIFFERENTIAL/PLATELET
BASOS ABS: 0 10*3/uL (ref 0.0–0.1)
Basophils Relative: 0 %
Eosinophils Absolute: 0.5 10*3/uL (ref 0.0–0.7)
Eosinophils Relative: 3 %
HEMATOCRIT: 42.1 % (ref 36.0–46.0)
Hemoglobin: 14.3 g/dL (ref 12.0–15.0)
LYMPHS ABS: 2.5 10*3/uL (ref 0.7–4.0)
LYMPHS PCT: 16 %
MCH: 32.4 pg (ref 26.0–34.0)
MCHC: 34 g/dL (ref 30.0–36.0)
MCV: 95.5 fL (ref 78.0–100.0)
Monocytes Absolute: 0.9 10*3/uL (ref 0.1–1.0)
Monocytes Relative: 6 %
NEUTROS PCT: 75 %
Neutro Abs: 11.5 10*3/uL — ABNORMAL HIGH (ref 1.7–7.7)
Platelets: 268 10*3/uL (ref 150–400)
RBC: 4.41 MIL/uL (ref 3.87–5.11)
RDW: 13.1 % (ref 11.5–15.5)
WBC: 15.5 10*3/uL — AB (ref 4.0–10.5)

## 2017-04-26 LAB — PREGNANCY, URINE: PREG TEST UR: NEGATIVE

## 2017-04-26 MED ORDER — FENTANYL CITRATE (PF) 100 MCG/2ML IJ SOLN
50.0000 ug | Freq: Once | INTRAMUSCULAR | Status: AC
  Administered 2017-04-26: 50 ug via INTRAVENOUS
  Filled 2017-04-26: qty 2

## 2017-04-26 MED ORDER — ONDANSETRON HCL 4 MG/2ML IJ SOLN
4.0000 mg | Freq: Once | INTRAMUSCULAR | Status: AC
  Administered 2017-04-26: 4 mg via INTRAVENOUS
  Filled 2017-04-26: qty 2

## 2017-04-26 MED ORDER — ONDANSETRON 8 MG PO TBDP: 8.0000 mg | ORAL_TABLET | Freq: Three times a day (TID) | ORAL | 0 refills | Status: DC | PRN

## 2017-04-26 MED ORDER — OXYCODONE-ACETAMINOPHEN 5-325 MG PO TABS: 1.0000 | ORAL_TABLET | ORAL | 0 refills | Status: DC | PRN

## 2017-04-26 NOTE — Discharge Instructions (Signed)
Your exam and history suggests a kidney stone as discussed.  It is possible you have passed a stone or it is small enough that is was not picked up on the CT scan. You may take the oxycodone prescribed for pain relief.  This will make you drowsy - do not drive within 4 hours of taking this medication.

## 2017-04-26 NOTE — ED Triage Notes (Signed)
Pt reports right flank pain with urinary frequency for 2 weeks.

## 2017-04-28 LAB — GC/CHLAMYDIA PROBE AMP (~~LOC~~) NOT AT ARMC
CHLAMYDIA, DNA PROBE: NEGATIVE
Neisseria Gonorrhea: NEGATIVE

## 2017-04-28 NOTE — ED Provider Notes (Signed)
[facility] DEPT Provider Note   CSN: [id number] Arrival date & time: 04/26/17  1714     History   Chief Complaint Chief Complaint  Patient presents with  . Flank Pain    HPI Alyssa Washington is a 46 y.o. female with a history as outlined below, most significant for kidney stones and Crohns disease with a prior subtotal colectomy and appendectomy presenting with intermittent right flank pain and increased urinary frequency for the past 2 weeks.  She denies painful urination and has had no hematuria, but reports darker than normal urine. She denies urgency, fevers, chills, bowel changes, abdominal distention.  She has found no alleviators or anything that triggers episodes of pain.  She states her symptoms are not consistent with her crohns which has been under fair control.  Her pain radiates from the right flank to the right lower quadrant. She denies vaginal discharge or pain.    The history is provided by the patient.    Past Medical History:  Diagnosis Date  . Abnormality of gait 10/28/2016  . Anxiety   . Arthritis   . Chronic fatigue 10/28/2016  . Depression   . Fibromyalgia   . 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 GERD (gastroesophageal reflux disease)   . Kidney stones   . Migraine   . MS (multiple sclerosis) ([place])   . Neuromuscular disorder ([place])   . Ulcerative colitis [facility])     Patient Active Problem List   Diagnosis Date Noted  . Abnormality of gait 10/28/2016  . Chronic fatigue 10/28/2016  . 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 Chronic insomnia 10/28/2016  . Multiple sclerosis ([place]) 10/10/2016  . Crohn disease ([place]) 10/10/2016  . Depression 10/10/2016  . GERD (gastroesophageal reflux disease) 10/10/2016  . Hyperlipidemia 10/10/2016  . Uterine leiomyoma 10/10/2016  . Panic attack 10/10/2016    Past Surgical History:  Procedure Laterality Date  . BACK SURGERY  1993  . BUNIONECTOMY  1997  . [place], 2004  . CHOLECYSTECTOMY    . HARDWARE  REMOVAL  2017  . HERNIA REPAIR    . INNER EAR SURGERY Right 1981  . LUMBAR FUSION  2012  . SUBTOTAL COLECTOMY  2007    OB History    No data available       Home Medications    Prior to Admission medications   Medication Sig Start Date End Date Taking? Authorizing Provider  Cholecalciferol 5000 units TABS Take 1 tablet by mouth daily.   Yes [provider]  citalopram (CELEXA) 10 MG tablet take 1 tablet by mouth once daily 04/14/17  Yes Eustaquio Maize, MD  Cyanocobalamin (B-12 PO) Take 1 tablet by mouth daily.   Yes [provider]  meloxicam (MOBIC) 15 MG tablet take 1 tablet by mouth once daily 04/09/17  Yes Eustaquio Maize, MD  omeprazole (PRILOSEC) 40 MG capsule Take 1 capsule (40 mg total) by mouth daily. 11/21/16  Yes Eustaquio Maize, MD  doxycycline (VIBRA-TABS) 100 MG tablet Take 1 tablet (100 mg total) by mouth 2 (two) times daily. 1 po bid Patient not taking: Reported on 04/26/2017 02/25/17   Dettinger, Fransisca Kaufmann, MD  LORazepam (ATIVAN) 0.5 MG tablet Take 1 tablet (0.5 mg total) by mouth 2 (two) times daily as needed for anxiety. Patient not taking: Reported on 04/26/2017 02/24/17   Dettinger, Fransisca Kaufmann, MD  ondansetron (ZOFRAN ODT) 8 MG disintegrating tablet Take 1 tablet (8 mg total) by mouth every 8 (eight)  hours as needed for nausea or vomiting. 04/26/17   Evalee Jefferson, PA-C  oxyCODONE-acetaminophen (PERCOCET/ROXICET) 5-325 MG tablet Take 1 tablet by mouth every 4 (four) hours as needed. 04/26/17   Evalee Jefferson, PA-C    Family History Family History  Problem Relation Age of Onset  . Arthritis Mother   . Cancer Mother   . Cancer Father   . COPD Father   . Depression Father   . Hyperlipidemia Sister   . Alcohol abuse Brother   . Drug abuse Brother   . Heart disease Maternal Uncle   . Diabetes Maternal Uncle   . Early death Maternal Uncle   . Depression Paternal Aunt   . Depression Paternal Uncle   . Heart disease Maternal Grandmother   . Early death  Maternal Grandmother   . Cancer Paternal Grandmother   . Depression Paternal Grandmother   . Alcohol abuse Paternal Grandfather   . Depression Paternal Grandfather     Social History Social History  Substance Use Topics  . Smoking status: Current Every Day Smoker    Packs/day: 0.50    Types: Cigarettes  . Smokeless tobacco: Never Used  . Alcohol use Yes     Comment: Occasional     Allergies   Dilaudid [hydromorphone hcl]; Amoxicillin; Azithromycin; Benadryl [diphenhydramine hcl]; Charcoal; Codeine; Erythromycin; Flomax [tamsulosin hcl]; Nortriptyline; Penicillins; Prednisone; Vicodin [hydrocodone-acetaminophen]; and Latex   Review of Systems Review of Systems  Constitutional: Negative for chills and fever.  HENT: Negative for congestion and sore throat.   Eyes: Negative.   Respiratory: Negative for chest tightness and shortness of breath.   Cardiovascular: Negative for chest pain.  Gastrointestinal: Positive for nausea. Negative for abdominal pain, blood in stool, constipation, diarrhea and vomiting.       Negative except as mentioned in HPI.    Genitourinary: Positive for frequency. Negative for hematuria, urgency, vaginal bleeding and vaginal discharge.  Musculoskeletal: Negative for arthralgias, joint swelling and neck pain.  Skin: Negative.  Negative for rash and wound.  Neurological: Negative for dizziness, weakness, light-headedness, numbness and headaches.  Psychiatric/Behavioral: Negative.      Physical Exam Updated Vital Signs BP 140/69 (BP Location: Left Arm)   Pulse 68   Temp 98 F (36.7 C) (Oral)   Resp 16   Ht 5\' 3"  (1.6 m)   Wt 70.3 kg   SpO2 99%   BMI 27.46 kg/m   Physical Exam  Constitutional: She appears well-developed and well-nourished.  HENT:  Head: Normocephalic and atraumatic.  Eyes: Conjunctivae are normal.  Neck: Normal range of motion.  Cardiovascular: Normal rate, regular rhythm, normal heart sounds and intact distal pulses.     Pulmonary/Chest: Effort normal and breath sounds normal. She has no wheezes.  Abdominal: Soft. Bowel sounds are normal. She exhibits no distension and no mass. There is tenderness in the right lower quadrant. There is CVA tenderness. There is no rebound and no guarding.  Mild rlq and right flank pain without guarding.  Genitourinary: Vagina normal and uterus normal. Cervix exhibits no motion tenderness and no discharge. Right adnexum displays no mass, no tenderness and no fullness. Left adnexum displays no mass, no tenderness and no fullness. No tenderness in the vagina. No vaginal discharge found.  Musculoskeletal: Normal range of motion.  Neurological: She is alert.  Skin: Skin is warm and dry.  Psychiatric: She has a normal mood and affect.  Nursing note and vitals reviewed.    ED Treatments / Results  Labs (all labs ordered are  listed, but only abnormal results are displayed) Labs Reviewed  WET PREP, GENITAL - Abnormal; Notable for the following:       Result Value   WBC, Wet Prep HPF POC FEW (*)    All other components within normal limits  URINALYSIS, ROUTINE W REFLEX MICROSCOPIC - Abnormal; Notable for the following:    Color, Urine STRAW (*)    All other components within normal limits  BASIC METABOLIC PANEL - Abnormal; Notable for the following:    Glucose, Bld 104 (*)    Calcium 8.6 (*)    All other components within normal limits  CBC WITH DIFFERENTIAL/PLATELET - Abnormal; Notable for the following:    WBC 15.5 (*)    Neutro Abs 11.5 (*)    All other components within normal limits  PREGNANCY, URINE  GC/CHLAMYDIA PROBE AMP ([place]) NOT AT [facility]    EKG  EKG Interpretation None       Radiology Ct Renal Stone Study  Result Date: 04/26/2017 CLINICAL DATA:  46 year old female with right abdominal and flank pain for 2 weeks. History of Crohn's disease. EXAM: CT ABDOMEN AND PELVIS WITHOUT CONTRAST TECHNIQUE: Multidetector CT imaging of the abdomen and pelvis was  performed following the standard protocol without IV contrast. COMPARISON:  None. FINDINGS: Please note that parenchymal abnormalities may be missed without intravenous contrast. Lower chest: No acute abnormality. Hepatobiliary: The liver and gallbladder are unremarkable. There is no evidence of biliary dilatation. Pancreas: Unremarkable Spleen: Unremarkable Adrenals/Urinary Tract: A nonobstructing 6 mm calculus within the right upper kidney and a punctate nonobstructing calculus within the left upper kidney are noted. There is no evidence of hydronephrosis or obstructing urinary calculi. The adrenal glands and bladder are unremarkable. Stomach/Bowel: Stomach is within normal limits. No evidence of bowel wall thickening, distention, or inflammatory changes. Subtotal colectomy changes are identified. Vascular/Lymphatic: Aortic atherosclerosis. No enlarged abdominal or pelvic lymph nodes. Reproductive: Uterus and bilateral adnexa are unremarkable. Other: No ascites, focal collection or pneumoperitoneum. Musculoskeletal: No acute abnormalities noted. Posterior fusion changes at L4-5 noted. IMPRESSION: No evidence of acute abnormality. Nonobstructing bilateral renal calculi. Abdominal aortic atherosclerosis. Electronically Signed   By: Margarette Canada M.D.   On: 04/26/2017 19:25    Procedures Procedures (including critical care time)  Medications Ordered in ED Medications  fentaNYL (SUBLIMAZE) injection 50 mcg (50 mcg Intravenous Given 04/26/17 1845)  ondansetron (ZOFRAN) injection 4 mg (4 mg Intravenous Given 04/26/17 1845)  fentaNYL (SUBLIMAZE) injection 50 mcg (50 mcg Intravenous Given 04/26/17 2031)     Initial Impression / Assessment and Plan / ED Course  I have reviewed the triage vital signs and the nursing notes.  Pertinent labs & imaging results that were available during my care of the patient were reviewed by me and considered in my medical decision making (see chart for details).     Imaging and  labs reviewed and discussed with pt.  Renal but no ureteral calculi, which does not appear to be the source of pain.  Discussed possible smaller stone within the ureter that could possibly be missed on Ct slices, however urine negative for rbc's. Possible ureteral colic.  Pt was referred to urology for further eval. No exam or hx to suggest this is crohns related.  Pt prescribed oxycodone and zofran for sx relief.  Final Clinical Impressions(s) / ED Diagnoses   Final diagnoses:  Right flank pain    New Prescriptions Discharge Medication List as of 04/26/2017  9:43 PM    START taking these  medications   Details  ondansetron (ZOFRAN ODT) 8 MG disintegrating tablet Take 1 tablet (8 mg total) by mouth every 8 (eight) hours as needed for nausea or vomiting., Starting Sat 04/26/2017, Print    oxyCODONE-acetaminophen (PERCOCET/ROXICET) 5-325 MG tablet Take 1 tablet by mouth every 4 (four) hours as needed., Starting Sat 04/26/2017, Print         Evalee Jefferson, PA-C 04/28/17 1256    Long, [facility], MD 04/29/17 1126

## 2017-05-08 ENCOUNTER — Telehealth: Payer: Self-pay

## 2017-05-08 ENCOUNTER — Other Ambulatory Visit: Payer: Self-pay | Admitting: Neurology

## 2017-05-08 ENCOUNTER — Other Ambulatory Visit: Payer: Self-pay | Admitting: Pediatrics

## 2017-05-08 MED ORDER — OXYCODONE-ACETAMINOPHEN 5-325 MG PO TABS: 1.0000 | ORAL_TABLET | Freq: Four times a day (QID) | ORAL | 0 refills | Status: DC | PRN

## 2017-05-08 NOTE — Telephone Encounter (Signed)
Refill at front desk for patients pick up.

## 2017-05-08 NOTE — Telephone Encounter (Signed)
Alyssa Ducking, MD 23 minutes ago (12:57 PM)      This patient recent got 20 tablets through the emergency room for right flank pain. She does not have a medication contract with this office. She missed her appointment in March 2018.  I will give a refill, we will need an earlier appointment than September as the patient is on Ocrevus.  The patient will need to sign a medication contract when she comes in.      Documentation

## 2017-05-08 NOTE — Telephone Encounter (Signed)
Patient called office requesting refill for oxyCODONE-acetaminophen (PERCOCET/ROXICET) 5-325 MG tablet. Advised office does close at noon tomorrow.

## 2017-05-08 NOTE — Telephone Encounter (Signed)
This patient recent got 20 tablets through the emergency room for right flank pain. She does not have a medication contract with this office. She missed her appointment in March 2018.  I will give a refill, we will need an earlier appointment than September as the patient is on Ocrevus.  The patient will need to sign a medication contract when she comes in.

## 2017-05-08 NOTE — Addendum Note (Signed)
Addended by: Kathrynn Ducking on: 05/08/2017 12:59 PM   Modules accepted: Orders

## 2017-05-08 NOTE — Telephone Encounter (Signed)
Rn call patient for early appt with NP. Rn stated per Dr. Jannifer Franklin she needs an appt because of the miss one in 02/2017. Pt stated her son had a car accident. Rn schedule pt on 06/04/2017 with the first available NP. Rn stated she needs to sign a pain contract with the pain guidelines. Pt verbalized understanding. Pt schedule.

## 2017-06-04 ENCOUNTER — Ambulatory Visit (INDEPENDENT_AMBULATORY_CARE_PROVIDER_SITE_OTHER): Payer: Medicare Other | Admitting: Nurse Practitioner

## 2017-06-04 ENCOUNTER — Encounter: Payer: Self-pay | Admitting: Nurse Practitioner

## 2017-06-04 ENCOUNTER — Telehealth: Payer: Self-pay | Admitting: *Deleted

## 2017-06-04 VITALS — BP 139/88 | HR 76 | Wt 156.8 lb

## 2017-06-04 DIAGNOSIS — R269 Unspecified abnormalities of gait and mobility: Secondary | ICD-10-CM

## 2017-06-04 DIAGNOSIS — G35 Multiple sclerosis: Secondary | ICD-10-CM | POA: Diagnosis not present

## 2017-06-04 NOTE — Patient Instructions (Signed)
Will review and sign narcotic agreement Labs today Next Ocrevus in August  Follow up in 6 months with Dr. Jannifer Franklin

## 2017-06-04 NOTE — Progress Notes (Signed)
[facility]  PATIENT: Alyssa Washington DOB: [birth year]   REASON FOR VISIT: Follow-up for multiple sclerosis HISTORY FROM: Patient    HISTORY OF PRESENT ILLNESS:UPDATE 06/13/2018CM Alyssa Washington, 46 year old female returns for follow-up after her initial evaluation with Dr. Jannifer Franklin in November of last year. She has a history of multiple sclerosis and had not been on any treatment for over 10 years. MRI of the brain and cervical spine 11/11/2016 Multiple white matter lesions in periventricular and juxta cortical white matter typical for demyelination and multiple sclerosis. No definite brainstem or cerebellar lesion identified. No abnormal enhancement or diffusion restriction to suggest an active process. 2. Multiple cervical spinal cord white matter lesions predominantly in the posterior column at C4, C5, and C6. No abnormal enhancement to suggest an active process. 3. Mild diffuse paranasal sinus disease. 4. Mild cervical spondylosis greatest at C5-6. No high-grade canal stenosis or foraminal narrowing. She had her first dose of Ocrevius in February, next dose is due in August. She is on Percocet for pain and a narcotic pain agreement was reviewed with her today and signed. According to the Narcotic Registry, She Received 30 Tablets of Percocet for a Kidney Stone in the Emergency Room Otherwise Her Percocet Has Only Been Prescribed by Dr. Jannifer Franklin. She Has a Chronic Gait Disorder and Wears an AFO to the Right Lower Leg. She Denies Any Falls. She Has a Neurogenic Bladder. She returns for reevaluation HISTORY 10/28/16 Alyssa Washington. Spallone is a 46 year old right-handed white female who has recently moved from [place] to this area with a history of multiple sclerosis since 1999. The patient indicates that she initially was treated with Copaxone, and was switched Avonex, then back to Copaxone. Eventually, she developed a paraparesis and had aggressive disease, she was treated with  Novantrone initially with good improvement. The patient initially presented with an optic neuritis on the right side. Over the last 10 years, the patient has not been on any disease modifying agents. The patient last had MRI of the brain, cervical spine, and thoracic spine in October 2016. This showed multiple chronic and acute lesions suggesting ongoing disease activity. The patient was to be set up for Rituxan, but this medication was never started. The patient has moved to this area to be near family. The patient is not married, she lives with her 47 year old and 52 year old children. The patient does operate a motor vehicle. She is on disability. 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 The patient has a neurogenic bladder. She walks with a cane, she may fall on occasion. She reports numbness in both feet and some discomfort in both feet. The patient does report some dizziness and some cognitive slowing. She does have some dizziness at times. She has significant fatigue, but she also reports chronic insomnia. The patient comes to this office to make contact with a neurologist for ongoing management of her multiple sclerosis.  REVIEW OF SYSTEMS: Full 14 system review of systems performed and notable only for those listed, all others are neg:  Constitutional: neg  Cardiovascular: neg Ear/Nose/Throat: neg  Skin: neg Eyes: Blurred vision Respiratory: neg Gastroitestinal: neg  Hematology/Lymphatic: neg  Endocrine: neg Musculoskeletal: Walking difficulty aching muscles and the pain Allergy/Immunology: neg Neurological: Weakness, tremors Psychiatric: neg Sleep : Insomnia   ALLERGIES: Allergies  Allergen Reactions  . Dilaudid [Hydromorphone Hcl] Anaphylaxis  . Amoxicillin Nausea And Vomiting  .  Azithromycin Nausea And Vomiting  . Benadryl [Diphenhydramine Hcl] Nausea Only      Elevated heart rate   . Charcoal Nausea And Vomiting  . Codeine Nausea And Vomiting  . Erythromycin     Elevated heart rate   . Flomax [Tamsulosin Hcl]   . Nortriptyline     Throat ulcers Weight gain   . Penicillins Nausea And Vomiting    Has patient had a PCN reaction causing immediate rash, facial/tongue/throat swelling, SOB or lightheadedness with hypotension: No Has patient had a PCN reaction causing severe rash involving mucus membranes or skin necrosis: No Has patient had a PCN reaction that required hospitalization No Has patient had a PCN reaction occurring within the last 10 years: Yes If all of the above answers are "NO", then may proceed with Cephalosporin use. Patient does not remember the reaction    . Prednisone Nausea And Vomiting    Cannot take Pill form  . Vicodin [Hydrocodone-Acetaminophen] Nausea And Vomiting  . Latex Hives and Rash    HOME MEDICATIONS: Outpatient Medications Prior to Visit  Medication Sig Dispense Refill  . Cholecalciferol 5000 units TABS Take 1 tablet by mouth every other day.     . citalopram (CELEXA) 10 MG tablet take 1 tablet by mouth once daily 30 tablet 1  . Cyanocobalamin (B-12 PO) Take 1 tablet by mouth daily.    Marland Kitchen doxycycline (VIBRA-TABS) 100 MG tablet Take 1 tablet (100 mg total) by mouth 2 (two) times daily. 1 po bid 20 tablet 0  . meloxicam (MOBIC) 15 MG tablet take 1 tablet by mouth once daily 30 tablet 0  . omeprazole (PRILOSEC) 40 MG capsule Take 1 capsule (40 mg total) by mouth daily. 90 capsule 1  . oxyCODONE-acetaminophen (PERCOCET/ROXICET) 5-325 MG tablet Take 1 tablet by mouth every 6 (six) hours as needed. 30 tablet 0  . LORazepam (ATIVAN) 0.5 MG tablet Take 1 tablet (0.5 mg total) by mouth 2 (two) times daily as needed for anxiety. (Patient not taking: Reported on 06/04/2017) 10 tablet 0  . ondansetron (ZOFRAN ODT) 8 MG disintegrating tablet Take 1 tablet (8 mg total) by mouth every 8 (eight) hours as needed for  nausea or vomiting. (Patient not taking: Reported on 06/04/2017) 20 tablet 0   No facility-administered medications prior to visit.     PAST MEDICAL HISTORY: Past Medical History:  Diagnosis Date  . Abnormality of gait 10/28/2016  . Anxiety   . Arthritis   . Chronic fatigue 10/28/2016  . Depression   . Fibromyalgia   . 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 GERD (gastroesophageal reflux disease)   . Kidney stones   . Migraine   . MS (multiple sclerosis) ([place])   . Neuromuscular disorder ([place])   . Ulcerative colitis ([place])     PAST SURGICAL HISTORY: Past Surgical History:  Procedure Laterality Date  . BACK SURGERY  1993  . BUNIONECTOMY  1997  . [place], 2004  . CHOLECYSTECTOMY    . HARDWARE REMOVAL  2017  . HERNIA REPAIR    . INNER EAR SURGERY Right 1981  . LUMBAR FUSION  2012  . SUBTOTAL COLECTOMY  2007    FAMILY HISTORY: Family History  Problem Relation Age of Onset  . Arthritis Mother   . Cancer Mother   . Cancer Father   . COPD Father   . Depression Father   . Hyperlipidemia Sister   . Alcohol abuse Brother   . Drug abuse Brother   .  Heart disease Maternal Uncle   . Diabetes Maternal Uncle   . Early death Maternal Uncle   . Depression Paternal Aunt   . Depression Paternal Uncle   . Heart disease Maternal Grandmother   . Early death Maternal Grandmother   . Cancer Paternal Grandmother   . Depression Paternal Grandmother   . Alcohol abuse Paternal Grandfather   . Depression Paternal Grandfather     SOCIAL HISTORY: Social History   Social History  . Marital status: Single    Spouse name: N/A  . Number of children: 2  . Years of education: Some college   Occupational History  . N/A    Social History Main Topics  . Smoking status: Current Every Day Smoker    Packs/day: 0.50    Types: Cigarettes  . Smokeless tobacco: Never Used  . Alcohol use Yes     Comment: Occasional  . Drug use: No  . Sexual activity: No   Other Topics  Concern  . Not on file   Social History Narrative   Lives at home w/ her children   Right-handed   Caffeine: 3 cups of coffee per day     PHYSICAL EXAM  Vitals:   06/04/17 0926  BP: 139/88  Pulse: 76  Weight: 156 lb 12.8 oz (71.1 kg)   Body mass index is 27.78 kg/m.  Generalized: Well developed, in no acute distress  Head: normocephalic and atraumatic,. Oropharynx benign  Neck: Supple, no carotid bruits  Cardiac: Regular rate rhythm, no murmur  Musculoskeletal: No deformity   Neurological examination   Mentation: Alert oriented to time, place, history taking. Attention span and concentration appropriate. Recent and remote memory intact.  Follows all commands speech and language fluent.   Cranial nerve II-XII: Fundoscopic exam reveals sharp disc margins.Pupils were equal round reactive to light extraocular movements were full, visual field were full on confrontational test. Facial sensation and strength were normal. hearing was intact to finger rubbing bilaterally. Uvula tongue midline. head turning and shoulder shrug were normal and symmetric.Tongue protrusion into cheek strength was normal. Motor: normal bulk and tone, full strength in the BUE, BLE,But there is a dystonia following her right hand with flexion at the wrist and the third fourth and fifth fingers, symmetric motor tone throughout Sensory: Sensory testing is notable for some decrease in pinprick sensation on the right arm relative to the left, symmetric in the lower extremities. Vibration sensation is decreased in both legs, decreased on the right hand relative to the left. Position sense is intact on all 4 extremities. No evidence of extinction is noted. Coordination: finger-nose-finger, heel-to-shin bilaterally, no dysmetria Reflexes: Symmetric upper and lower plantar responses were flexor bilaterally. Gait and Station: Rising up from seated position without assistance, wide based unsteady gait, tandem gait is  unsteady Romberg is negative DIAGNOSTIC DATA (LABS, IMAGING, TESTING) - I reviewed patient records, labs, notes, testing and imaging myself where available.  Lab Results  Component Value Date   WBC 15.5 (H) 04/26/2017   HGB 14.3 04/26/2017   HCT 42.1 04/26/2017   MCV 95.5 04/26/2017   PLT 268 04/26/2017      Component Value Date/Time   NA 137 04/26/2017 1844   NA 138 02/24/2017 0959   K 3.5 04/26/2017 1844   CL 107 04/26/2017 1844   CO2 25 04/26/2017 1844   GLUCOSE 104 (H) 04/26/2017 1844   BUN 16 04/26/2017 1844   BUN 20 02/24/2017 0959   CREATININE 0.83 04/26/2017 1844   CALCIUM  8.6 (L) 04/26/2017 1844   PROT 6.7 02/24/2017 0959   ALBUMIN 4.2 02/24/2017 0959   AST 12 02/24/2017 0959   ALT 21 02/24/2017 0959   ALKPHOS 86 02/24/2017 0959   BILITOT 0.3 02/24/2017 0959   GFRNONAA >60 04/26/2017 1844   GFRAA >60 04/26/2017 1844   Lab Results  Component Value Date   CHOL 212 (H) 10/10/2016   HDL 50 10/10/2016   LDLCALC 136 (H) 10/10/2016   TRIG 131 10/10/2016   CHOLHDL 4.2 10/10/2016   No results found for: HGBA1C Lab Results  Component Value Date   VITAMINB12 650 02/24/2017   Lab Results  Component Value Date   TSH 1.580 02/24/2017    11/11/16 MRI of the brain shows no enhancing lesions there does appear to be multiple lesions in the spinal cord involvement of the brain.See above   ASSESSMENT AND PLAN  46 y.o. year old female with a diagnosis of multiple sclerosis, gait disorder, right upper extremity dystonia, neurogenic bladder, chronic fatigue, memory loss. She has a history of MS for many years and had not been on any therapy for 10 years prior to seeing Dr. Jannifer Franklin in November of last year. She received her first dose of Ocrevus in February. Next dose is due in August.    PLAN Reviewed  and signed  narcotic agreement Labs today, CBC and CMP  Next Ocrevus infusion in August  Continue Percocet for pain her last prescription was 5/17 /18. This was actually  given to her in the ER for kidney stone. Reviewed [place]  narcotic registry Follow up in 6 months Dennie Bible, [facility], [facility], APRN  [facility] [address] [place], [place] [address] [phone]

## 2017-06-04 NOTE — Progress Notes (Signed)
I have read the note, and I agree with the clinical assessment and plan.  Alyssa Washington,Alyssa Washington   

## 2017-06-04 NOTE — Telephone Encounter (Signed)
Patient saw CM,NP today. Pt requesting refill for oxycodone. Spoke with Dr Jannifer Franklin. Patient can pick up on Friday. Patient was notified at check out.

## 2017-06-05 LAB — 733690 12+OXYCODONE+CRT-SCR

## 2017-06-05 LAB — MED LIST OPTION NOT SELECTED

## 2017-06-06 LAB — CBC WITH DIFFERENTIAL/PLATELET
BASOS ABS: 0 10*3/uL (ref 0.0–0.2)
Basos: 0 %
EOS (ABSOLUTE): 0.4 10*3/uL (ref 0.0–0.4)
Eos: 3 %
Hematocrit: 47.7 % — ABNORMAL HIGH (ref 34.0–46.6)
Hemoglobin: 15.4 g/dL (ref 11.1–15.9)
IMMATURE GRANS (ABS): 0 10*3/uL (ref 0.0–0.1)
IMMATURE GRANULOCYTES: 0 %
LYMPHS: 22 %
Lymphocytes Absolute: 2.4 10*3/uL (ref 0.7–3.1)
MCH: 31.8 pg (ref 26.6–33.0)
MCHC: 32.3 g/dL (ref 31.5–35.7)
MCV: 98 fL — ABNORMAL HIGH (ref 79–97)
Monocytes Absolute: 0.7 10*3/uL (ref 0.1–0.9)
Monocytes: 6 %
NEUTROS PCT: 69 %
Neutrophils Absolute: 7.6 10*3/uL — ABNORMAL HIGH (ref 1.4–7.0)
PLATELETS: 323 10*3/uL (ref 150–379)
RBC: 4.85 x10E6/uL (ref 3.77–5.28)
RDW: 13.1 % (ref 12.3–15.4)
WBC: 11.1 10*3/uL — ABNORMAL HIGH (ref 3.4–10.8)

## 2017-06-06 LAB — COMPREHENSIVE METABOLIC PANEL
A/G RATIO: 1.8 (ref 1.2–2.2)
ALT: 13 IU/L (ref 0–32)
AST: 17 IU/L (ref 0–40)
Albumin: 4.5 g/dL (ref 3.5–5.5)
Alkaline Phosphatase: 101 IU/L (ref 39–117)
BUN/Creatinine Ratio: 24 — ABNORMAL HIGH (ref 9–23)
BUN: 20 mg/dL (ref 6–24)
Bilirubin Total: 0.5 mg/dL (ref 0.0–1.2)
CALCIUM: 9.4 mg/dL (ref 8.7–10.2)
CHLORIDE: 100 mmol/L (ref 96–106)
CO2: 24 mmol/L (ref 20–29)
Creatinine, Ser: 0.83 mg/dL (ref 0.57–1.00)
GFR calc Af Amer: 97 mL/min/{1.73_m2} (ref >59)
GFR, EST NON AFRICAN AMERICAN: 84 mL/min/{1.73_m2} (ref >59)
GLUCOSE: 119 mg/dL — AB (ref 65–99)
Globulin, Total: 2.5 g/dL (ref 1.5–4.5)
POTASSIUM: 5.4 mmol/L — AB (ref 3.5–5.2)
Sodium: 138 mmol/L (ref 134–144)
Total Protein: 7 g/dL (ref 6.0–8.5)

## 2017-06-06 MED ORDER — OXYCODONE-ACETAMINOPHEN 5-325 MG PO TABS: 1.0000 | ORAL_TABLET | Freq: Four times a day (QID) | ORAL | 0 refills | Status: DC | PRN

## 2017-06-06 NOTE — Addendum Note (Signed)
Addended by: Kathrynn Ducking on: 06/06/2017 07:34 AM   Modules accepted: Orders

## 2017-06-06 NOTE — Telephone Encounter (Signed)
Oxycodone was refilled today.

## 2017-06-06 NOTE — Telephone Encounter (Signed)
Placed printed/signed rx oxycodone up front for patient pick up.  

## 2017-06-19 ENCOUNTER — Other Ambulatory Visit: Payer: Self-pay | Admitting: Pediatrics

## 2017-07-31 ENCOUNTER — Telehealth: Payer: Self-pay | Admitting: Neurology

## 2017-07-31 MED ORDER — OXYCODONE-ACETAMINOPHEN 5-325 MG PO TABS: 1.0000 | ORAL_TABLET | Freq: Four times a day (QID) | ORAL | 0 refills | Status: DC | PRN

## 2017-07-31 NOTE — Telephone Encounter (Signed)
The oxycodone prescription will be refilled. 

## 2017-07-31 NOTE — Telephone Encounter (Signed)
Pt request refill for oxyCODONE-acetaminophen (PERCOCET/ROXICET) 5-325 MG tablet, she is aware the clinic closes at noon tomorrow

## 2017-08-01 NOTE — Telephone Encounter (Signed)
Placed printed/signed rx up front for patient pick up.  

## 2017-08-06 ENCOUNTER — Other Ambulatory Visit: Payer: Self-pay | Admitting: Neurology

## 2017-08-06 DIAGNOSIS — G35 Multiple sclerosis: Secondary | ICD-10-CM | POA: Diagnosis not present

## 2017-08-06 MED ORDER — OXYCODONE-ACETAMINOPHEN 10-325 MG PO TABS: 1.0000 | ORAL_TABLET | Freq: Four times a day (QID) | ORAL | 0 refills | Status: DC | PRN

## 2017-08-14 ENCOUNTER — Other Ambulatory Visit: Payer: Self-pay | Admitting: Pediatrics

## 2017-08-14 DIAGNOSIS — F329 Major depressive disorder, single episode, unspecified: Secondary | ICD-10-CM

## 2017-08-14 DIAGNOSIS — F32A Depression, unspecified: Secondary | ICD-10-CM

## 2017-08-28 ENCOUNTER — Ambulatory Visit: Payer: Medicare Other | Admitting: Neurology

## 2017-09-12 ENCOUNTER — Telehealth: Payer: Self-pay | Admitting: Neurology

## 2017-09-12 ENCOUNTER — Encounter: Payer: Self-pay | Admitting: Nurse Practitioner

## 2017-09-12 ENCOUNTER — Other Ambulatory Visit: Payer: Self-pay | Admitting: Neurology

## 2017-09-12 ENCOUNTER — Ambulatory Visit (INDEPENDENT_AMBULATORY_CARE_PROVIDER_SITE_OTHER): Payer: Medicare Other | Admitting: Nurse Practitioner

## 2017-09-12 VITALS — BP 132/89 | HR 82 | Temp 98.5°F | Ht 63.0 in | Wt 156.0 lb

## 2017-09-12 DIAGNOSIS — M7712 Lateral epicondylitis, left elbow: Secondary | ICD-10-CM | POA: Diagnosis not present

## 2017-09-12 DIAGNOSIS — F3341 Major depressive disorder, recurrent, in partial remission: Secondary | ICD-10-CM | POA: Diagnosis not present

## 2017-09-12 DIAGNOSIS — Z5181 Encounter for therapeutic drug level monitoring: Secondary | ICD-10-CM

## 2017-09-12 DIAGNOSIS — M778 Other enthesopathies, not elsewhere classified: Secondary | ICD-10-CM

## 2017-09-12 DIAGNOSIS — M25562 Pain in left knee: Principal | ICD-10-CM

## 2017-09-12 DIAGNOSIS — M25552 Pain in left hip: Principal | ICD-10-CM

## 2017-09-12 DIAGNOSIS — M25561 Pain in right knee: Principal | ICD-10-CM

## 2017-09-12 DIAGNOSIS — G8929 Other chronic pain: Secondary | ICD-10-CM

## 2017-09-12 DIAGNOSIS — M25551 Pain in right hip: Principal | ICD-10-CM

## 2017-09-12 MED ORDER — CITALOPRAM HYDROBROMIDE 20 MG PO TABS: 20.0000 mg | ORAL_TABLET | Freq: Every day | ORAL | 5 refills | Status: DC

## 2017-09-12 MED ORDER — OXYCODONE-ACETAMINOPHEN 10-325 MG PO TABS: 1.0000 | ORAL_TABLET | Freq: Four times a day (QID) | ORAL | 0 refills | Status: DC | PRN

## 2017-09-12 NOTE — Telephone Encounter (Signed)
Pt called in she has been experiencing achey joints (all the joints) and chills (cold sweat chills-not like flu chills) since last ocrevus infusion 08/05/17. Said she has been waiting thinking it would dissipate but it has not. She said today is the 1st day she has not had constant chills. Pt is aware the clinic closes at noon today. Please call to discuss

## 2017-09-12 NOTE — Telephone Encounter (Signed)
Pt request refill for oxyCODONE-acetaminophen (PERCOCET) 10-325 MG tablet . Pt is aware clinic closes at noon today
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
## 2017-09-12 NOTE — Telephone Encounter (Signed)
I called the patient. The day after the Ocrevus injection, she began having diffuse joint pains particularly in the hands. She is having ongoing issues of month after the injection.  Arthralgias are not listed in the package insert, back pain and pain in extremity is listed.  We will have patient come in for blood work, we may consider a rheumatology evaluation in the future.

## 2017-09-12 NOTE — Patient Instructions (Signed)
Tennis Elbow Tennis elbow is puffiness (inflammation) of the outer tendons of your forearm close to your elbow. Your tendons attach your muscles to your bones. Tennis elbow can happen in any sport or job in which you use your elbow too much. It is caused by doing the same motion over and over. Tennis elbow can cause:  Pain and tenderness in your forearm and the outer part of your elbow.  A burning feeling. This runs from your elbow through your arm.  Weak grip in your hands.  Follow these instructions at home: Activity  Rest your elbow and wrist as told by your doctor. Try to avoid any activities that caused the problem until your doctor says that you can do them again.  If a physical therapist teaches you exercises, do all of them as told.  If you lift an object, lift it with your palm facing up. This is easier on your elbow. Lifestyle  If your tennis elbow is caused by sports, check your equipment and make sure that: ? You are using it correctly. ? It fits you well.  If your tennis elbow is caused by work, take breaks often, if you are able. Talk with your manager about doing your work in a way that is safe for you. ? If your tennis elbow is caused by computer use, talk with your manager about any changes that can be made to your work setup. General instructions  If told, apply ice to the painful area: ? Put ice in a plastic bag. ? Place a towel between your skin and the bag. ? Leave the ice on for 20 minutes, 2-3 times per day.  Take medicines only as told by your doctor.  If you were given a brace, wear it as told by your doctor.  Keep all follow-up visits as told by your doctor. This is important. Contact a doctor if:  Your pain does not get better with treatment.  Your pain gets worse.  You have weakness in your forearm, hand, or fingers.  You cannot feel your forearm, hand, or fingers. This information is not intended to replace advice given to you by your health  care provider. Make sure you discuss any questions you have with your health care provider. Document Released: 05/29/2010 Document Revised: 08/08/2016 Document Reviewed: 12/05/2014 Elsevier Interactive Patient Education  2018 Elsevier Inc.  

## 2017-09-15 NOTE — Telephone Encounter (Signed)
Placed printed/signed rx up front for patient pick up.  

## 2017-09-23 ENCOUNTER — Other Ambulatory Visit (INDEPENDENT_AMBULATORY_CARE_PROVIDER_SITE_OTHER): Payer: Self-pay

## 2017-09-23 ENCOUNTER — Other Ambulatory Visit: Payer: Self-pay | Admitting: Pediatrics

## 2017-09-23 DIAGNOSIS — M25551 Pain in right hip: Principal | ICD-10-CM

## 2017-09-23 DIAGNOSIS — M25561 Pain in right knee: Secondary | ICD-10-CM | POA: Diagnosis not present

## 2017-09-23 DIAGNOSIS — Z5181 Encounter for therapeutic drug level monitoring: Secondary | ICD-10-CM

## 2017-09-23 DIAGNOSIS — G8929 Other chronic pain: Secondary | ICD-10-CM

## 2017-09-23 DIAGNOSIS — M25552 Pain in left hip: Principal | ICD-10-CM

## 2017-09-23 DIAGNOSIS — M25562 Pain in left knee: Principal | ICD-10-CM

## 2017-09-23 DIAGNOSIS — Z0289 Encounter for other administrative examinations: Secondary | ICD-10-CM

## 2017-09-23 NOTE — Telephone Encounter (Signed)
Pt is asking for a call back to confirm there is an order for blood work before she comes in today, please call

## 2017-09-23 NOTE — Telephone Encounter (Signed)
I called pt. I advised her that Dr. Jannifer Franklin has placed several lab orders and that pt may come in today during business hours for her blood draw.  Pt reports that as each day goes by, she is feeling worse. Pt is still having joint pain and chills. I advised pt that I will inform Dr. Jannifer Franklin of this, but he may wait until he receives blood results back before making a decision on how to proceed from here. Pt verbalized understanding.

## 2017-09-25 ENCOUNTER — Telehealth: Payer: Self-pay | Admitting: Neurology

## 2017-09-25 DIAGNOSIS — M25551 Pain in right hip: Principal | ICD-10-CM

## 2017-09-25 DIAGNOSIS — M25562 Pain in left knee: Principal | ICD-10-CM

## 2017-09-25 DIAGNOSIS — M25552 Pain in left hip: Principal | ICD-10-CM

## 2017-09-25 DIAGNOSIS — M25561 Pain in right knee: Principal | ICD-10-CM

## 2017-09-25 DIAGNOSIS — G8929 Other chronic pain: Secondary | ICD-10-CM

## 2017-09-25 LAB — COMPREHENSIVE METABOLIC PANEL
A/G RATIO: 2 (ref 1.2–2.2)
ALBUMIN: 4.4 g/dL (ref 3.5–5.5)
ALT: 14 IU/L (ref 0–32)
AST: 17 IU/L (ref 0–40)
Alkaline Phosphatase: 76 IU/L (ref 39–117)
BUN / CREAT RATIO: 13 (ref 9–23)
BUN: 11 mg/dL (ref 6–24)
CHLORIDE: 100 mmol/L (ref 96–106)
CO2: 26 mmol/L (ref 20–29)
Calcium: 9.3 mg/dL (ref 8.7–10.2)
Creatinine, Ser: 0.85 mg/dL (ref 0.57–1.00)
GFR calc non Af Amer: 82 mL/min/{1.73_m2} (ref >59)
GFR, EST AFRICAN AMERICAN: 94 mL/min/{1.73_m2} (ref >59)
Globulin, Total: 2.2 g/dL (ref 1.5–4.5)
Glucose: 74 mg/dL (ref 65–99)
Potassium: 4.2 mmol/L (ref 3.5–5.2)
Sodium: 140 mmol/L (ref 134–144)
TOTAL PROTEIN: 6.6 g/dL (ref 6.0–8.5)

## 2017-09-25 LAB — ANGIOTENSIN CONVERTING ENZYME: ANGIO CONVERT ENZYME: 34 U/L (ref 14–82)

## 2017-09-25 LAB — CBC WITH DIFFERENTIAL/PLATELET
Basophils Absolute: 0 10*3/uL (ref 0.0–0.2)
Basos: 0 %
EOS (ABSOLUTE): 0.3 10*3/uL (ref 0.0–0.4)
Eos: 3 %
HEMATOCRIT: 44.5 % (ref 34.0–46.6)
HEMOGLOBIN: 14.4 g/dL (ref 11.1–15.9)
IMMATURE GRANS (ABS): 0 10*3/uL (ref 0.0–0.1)
IMMATURE GRANULOCYTES: 0 %
LYMPHS ABS: 1.7 10*3/uL (ref 0.7–3.1)
Lymphs: 20 %
MCH: 31.9 pg (ref 26.6–33.0)
MCHC: 32.4 g/dL (ref 31.5–35.7)
MCV: 99 fL — AB (ref 79–97)
MONOCYTES: 6 %
Monocytes Absolute: 0.5 10*3/uL (ref 0.1–0.9)
NEUTROS ABS: 6 10*3/uL (ref 1.4–7.0)
Neutrophils: 71 %
Platelets: 316 10*3/uL (ref 150–379)
RBC: 4.52 x10E6/uL (ref 3.77–5.28)
RDW: 12.9 % (ref 12.3–15.4)
WBC: 8.5 10*3/uL (ref 3.4–10.8)

## 2017-09-25 LAB — ANA W/REFLEX: Anti Nuclear Antibody(ANA): NEGATIVE

## 2017-09-25 LAB — RHEUMATOID FACTOR: Rhuematoid fact SerPl-aCnc: <10 IU/mL (ref 0.0–13.9)

## 2017-09-25 LAB — B. BURGDORFI ANTIBODIES

## 2017-09-25 LAB — SEDIMENTATION RATE: Sed Rate: 2 mm/hr (ref 0–32)

## 2017-09-25 LAB — C-REACTIVE PROTEIN: CRP: 1.6 mg/L (ref 0.0–4.9)

## 2017-09-25 MED ORDER — DEXAMETHASONE 2 MG PO TABS: ORAL_TABLET | ORAL | 0 refills | Status: DC

## 2017-09-25 NOTE — Telephone Encounter (Signed)
Pt called request lab results.  °

## 2017-09-25 NOTE — Telephone Encounter (Signed)
The Lyme antibody panel still pending. The rest of the blood work is still normal. Etiology of the arthralgias is not clear, clearly started after Ocrevus injection.  I will give a trial on a Decadron taper, getting a rheumatology referral.

## 2017-10-14 ENCOUNTER — Other Ambulatory Visit: Payer: Self-pay | Admitting: Pediatrics

## 2017-10-14 DIAGNOSIS — F32A Depression, unspecified: Secondary | ICD-10-CM

## 2017-10-14 DIAGNOSIS — F329 Major depressive disorder, single episode, unspecified: Secondary | ICD-10-CM

## 2017-10-17 ENCOUNTER — Other Ambulatory Visit: Payer: Self-pay | Admitting: *Deleted

## 2017-10-17 DIAGNOSIS — F3341 Major depressive disorder, recurrent, in partial remission: Secondary | ICD-10-CM

## 2017-10-17 MED ORDER — CITALOPRAM HYDROBROMIDE 20 MG PO TABS: 20.0000 mg | ORAL_TABLET | Freq: Every day | ORAL | 0 refills | Status: DC

## 2017-10-21 ENCOUNTER — Other Ambulatory Visit: Payer: Self-pay | Admitting: *Deleted

## 2017-10-24 ENCOUNTER — Other Ambulatory Visit: Payer: Self-pay

## 2017-10-24 DIAGNOSIS — F3341 Major depressive disorder, recurrent, in partial remission: Secondary | ICD-10-CM

## 2017-10-24 MED ORDER — CITALOPRAM HYDROBROMIDE 20 MG PO TABS: 20.0000 mg | ORAL_TABLET | Freq: Every day | ORAL | 0 refills | Status: DC

## 2017-10-24 MED ORDER — MELOXICAM 15 MG PO TABS: 15.0000 mg | ORAL_TABLET | Freq: Every day | ORAL | 0 refills | Status: DC

## 2017-10-24 MED ORDER — OMEPRAZOLE 40 MG PO CPDR: 40.0000 mg | DELAYED_RELEASE_CAPSULE | Freq: Every day | ORAL | 0 refills | Status: DC

## 2017-10-27 ENCOUNTER — Telehealth: Payer: Self-pay | Admitting: Neurology

## 2017-10-27 MED ORDER — OXYCODONE-ACETAMINOPHEN 10-325 MG PO TABS: 1.0000 | ORAL_TABLET | Freq: Four times a day (QID) | ORAL | 0 refills | Status: DC | PRN

## 2017-10-27 NOTE — Telephone Encounter (Signed)
Pt request refill for oxyCODONE-acetaminophen (PERCOCET) 10-325 MG tablet °

## 2017-10-27 NOTE — Telephone Encounter (Signed)
A prescription for the oxycodone was written.

## 2017-10-28 NOTE — Telephone Encounter (Signed)
Placed printed/signed rx up front for patient pick up.  

## 2017-10-29 ENCOUNTER — Other Ambulatory Visit: Payer: Self-pay | Admitting: Pediatrics

## 2017-11-07 ENCOUNTER — Telehealth: Payer: Self-pay

## 2017-11-07 ENCOUNTER — Telehealth: Payer: Self-pay | Admitting: Neurology

## 2017-11-07 DIAGNOSIS — G35 Multiple sclerosis: Secondary | ICD-10-CM

## 2017-11-07 NOTE — Telephone Encounter (Signed)
I called the patient.  I will plan on getting MRI of the brain and cervical spine when she is seen next in December 2018.  If desired, we can go ahead and order the scans now of the brain and cervical spine.  She is to call us and let us know.

## 2017-11-07 NOTE — Addendum Note (Signed)
Addended by: Kathrynn Ducking on: 11/07/2017 09:31 AM   Modules accepted: Orders

## 2017-11-07 NOTE — Telephone Encounter (Signed)
Patient is wondering if she is supposed to be having a repeat MRI, last one was 10/2016.

## 2017-11-07 NOTE — Telephone Encounter (Signed)
Pt states Dr Jannifer Franklin just called re: her MRI.  Pt states that she would like to have the MRI done as soon as possible and be able to discuss it when she comes for her December 17th appointment.  Please have pt scheduled for MRI as soon as possible

## 2017-11-07 NOTE — Telephone Encounter (Signed)
MRI of the cervical spine and brain will be set up.

## 2017-11-28 ENCOUNTER — Other Ambulatory Visit: Payer: Self-pay | Admitting: Nurse Practitioner

## 2017-12-01 ENCOUNTER — Inpatient Hospital Stay: Admission: RE | Admit: 2017-12-01 | Payer: Medicare Other | Source: Ambulatory Visit

## 2017-12-01 ENCOUNTER — Other Ambulatory Visit: Payer: Medicare Other

## 2017-12-05 ENCOUNTER — Telehealth: Payer: Self-pay | Admitting: Neurology

## 2017-12-05 ENCOUNTER — Other Ambulatory Visit: Payer: Self-pay | Admitting: Neurology

## 2017-12-05 MED ORDER — OXYCODONE-ACETAMINOPHEN 10-325 MG PO TABS: 1.0000 | ORAL_TABLET | Freq: Four times a day (QID) | ORAL | 0 refills | Status: DC | PRN

## 2017-12-05 NOTE — Telephone Encounter (Signed)
Pt calling for refill prescription for oxyCODONE-acetaminophen (PERCOCET) 10-325 MG tablet

## 2017-12-08 ENCOUNTER — Ambulatory Visit (INDEPENDENT_AMBULATORY_CARE_PROVIDER_SITE_OTHER): Payer: Medicare Other | Admitting: Neurology

## 2017-12-08 ENCOUNTER — Telehealth: Payer: Self-pay | Admitting: Neurology

## 2017-12-08 ENCOUNTER — Telehealth: Payer: Self-pay | Admitting: *Deleted

## 2017-12-08 ENCOUNTER — Encounter: Payer: Self-pay | Admitting: Neurology

## 2017-12-08 VITALS — BP 110/78 | HR 81 | Ht 63.0 in | Wt 161.5 lb

## 2017-12-08 DIAGNOSIS — R269 Unspecified abnormalities of gait and mobility: Secondary | ICD-10-CM

## 2017-12-08 DIAGNOSIS — Z5181 Encounter for therapeutic drug level monitoring: Secondary | ICD-10-CM

## 2017-12-08 DIAGNOSIS — G35 Multiple sclerosis: Secondary | ICD-10-CM | POA: Diagnosis not present

## 2017-12-08 MED ORDER — ALPRAZOLAM 0.5 MG PO TABS: ORAL_TABLET | ORAL | 0 refills | Status: DC

## 2017-12-08 NOTE — Addendum Note (Signed)
Addended by: Kathrynn Ducking on: 12/08/2017 01:03 PM   Modules accepted: Orders

## 2017-12-08 NOTE — Progress Notes (Signed)
Reason for visit: Multiple sclerosis  Alyssa Washington is an 46 y.o. female  History of present illness:  Alyssa Washington is a 46 year old right-handed white female with a history of multiple sclerosis.  The patient is on Ocrevus, she has not tolerated the drug well secondary to arthralgias that started with the first injection and got worse after the second injection in September 2018.  The patient has undergone blood work that was unrevealing, she has been referred to a rheumatology physician, but she has not yet set up this appointment.  MRI of the brain and cervical spine have been reordered to compare to last year, this has not yet been done.  The patient takes Mobic for the arthralgias which does seem to help.  Otherwise, she has not noted any new numbness or weakness of the face, arms, or legs.  She has not had any changes in bladder control or vision but she does note some blurring of vision.  The patient has some neck discomfort that will come and go and is brought on by physical activity.  The fatigue remains significant, the patient feels "weak all over".  She does have a mild balance issue, she uses a [place] crutch to walk, she denies any falls.  Past Medical History:  Diagnosis Date  . Abnormality of gait 10/28/2016  . Anxiety   . Arthritis   . Chronic fatigue 10/28/2016  . Depression   . Fibromyalgia   . 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 GERD (gastroesophageal reflux disease)   . Kidney stones   . Migraine   . MS (multiple sclerosis) ([place])   . Neuromuscular disorder ([place])   . Ulcerative colitis [facility])     Past Surgical History:  Procedure Laterality Date  . BACK SURGERY  1993  . BUNIONECTOMY  1997  . [place], 2004  . CHOLECYSTECTOMY    . HARDWARE REMOVAL  2017  . HERNIA REPAIR    . INNER EAR SURGERY Right 1981  . LUMBAR FUSION  2012  . SUBTOTAL COLECTOMY  2007    Family History  Problem Relation Age of Onset  . Arthritis Mother   .  Cancer Mother   . Cancer Father   . COPD Father   . Depression Father   . Hyperlipidemia Sister   . Alcohol abuse Brother   . Drug abuse Brother   . Heart disease Maternal Uncle   . Diabetes Maternal Uncle   . Early death Maternal Uncle   . Depression Paternal Aunt   . Depression Paternal Uncle   . Heart disease Maternal Grandmother   . Early death Maternal Grandmother   . Cancer Paternal Grandmother   . Depression Paternal Grandmother   . Alcohol abuse Paternal Grandfather   . Depression Paternal Grandfather     Social history:  reports that she has been smoking cigarettes.  She has been smoking about 0.50 packs per day. she has never used smokeless tobacco. She reports that she drinks alcohol. She reports that she does not use drugs.    Allergies  Allergen Reactions  . Dilaudid [Hydromorphone Hcl] Anaphylaxis  . Amoxicillin Nausea And Vomiting  . Azithromycin Nausea And Vomiting  . Benadryl [Diphenhydramine Hcl] Nausea Only    Elevated heart rate   . Charcoal Nausea And Vomiting  . Codeine Nausea And Vomiting  . Erythromycin     Elevated heart rate   . Flomax [Tamsulosin Hcl]   . Nortriptyline  Throat ulcers Weight gain   . Penicillins Nausea And Vomiting    Has patient had a PCN reaction causing immediate rash, facial/tongue/throat swelling, SOB or lightheadedness with hypotension: No Has patient had a PCN reaction causing severe rash involving mucus membranes or skin necrosis: No Has patient had a PCN reaction that required hospitalization No Has patient had a PCN reaction occurring within the last 10 years: Yes If all of the above answers are "NO", then may proceed with Cephalosporin use. Patient does not remember the reaction    . Prednisone Nausea And Vomiting    Cannot take Pill form  . Vicodin [Hydrocodone-Acetaminophen] Nausea And Vomiting  . Latex Hives and Rash    Medications:  Prior to Admission medications   Medication Sig Start Date End Date  Taking? Authorizing Provider  citalopram (CELEXA) 20 MG tablet Take 1 tablet (20 mg total) by mouth daily. 10/24/17  Yes Martin, Mary-Margaret, FNP  Cyanocobalamin (B-12 PO) Take 1 tablet by mouth daily.   Yes [provider]  meloxicam (MOBIC) 15 MG tablet Take 1 tablet (15 mg total) by mouth daily. 11/28/17  Yes Eustaquio Maize, MD  ocrelizumab 600 mg in sodium chloride 0.9 % 500 mL Inject 600 mg into the vein every 6 (six) months.   Yes [provider]  omeprazole (PRILOSEC) 40 MG capsule Take 1 capsule (40 mg total) by mouth daily. 10/24/17  Yes Martin, Mary-Margaret, FNP  oxyCODONE-acetaminophen (PERCOCET) 10-325 MG tablet Take 1 tablet by mouth every 6 (six) hours as needed for pain. 12/05/17  Yes Kathrynn Ducking, MD    ROS:  Out of a complete 14 system review of symptoms, the patient complains only of the following symptoms, and all other reviewed systems are negative.  Fatigue Joint pain  Blood pressure 110/78, pulse 81, height 5\' 3"  (1.6 m), weight 161 lb 8 oz (73.3 kg), SpO2 98 %.  Physical Exam  General: The patient is alert and cooperative at the time of the examination.  Skin: No significant peripheral edema is noted.   Neurologic Exam  Mental status: The patient is alert and oriented x 3 at the time of the examination. The patient has apparent normal recent and remote memory, with an apparently normal attention span and concentration ability.   Cranial nerves: Facial symmetry is present. Speech is normal, no aphasia or dysarthria is noted. Extraocular movements are full. Visual fields are full.  Pupils are equal, round, and reactive to light.  Discs are flat bilaterally.  Motor: The patient has good strength in all 4 extremities.  Sensory examination: Soft touch sensation is symmetric on the face, arms, and legs.  Coordination: The patient has good finger-nose-finger and heel-to-shin bilaterally.  Gait and station: The patient has a normal gait.  Tandem gait is slightly unsteady. Romberg is negative. No drift is seen.  Reflexes: Deep tendon reflexes are symmetric.   Assessment/Plan:  1.  Multiple sclerosis  2.  Diffuse arthralgias  3.  Gait disturbance  4.  Generalized fatigue  The patient will be set up for MRI of the brain and cervical spine, she is to see a rheumatologist in the near future.  Blood work will be done today, urine drug screen will be done.  The patient will follow-up in 6 months.  If the etiology of the arthralgias is not determined, we may need to stop the Ocrevus and switch to another agent.  Jill Alexanders MD 12/08/2017 10:46 AM  [facility] [address]  [place], [place] [address]  Phone [phone] Fax [phone]

## 2017-12-08 NOTE — Telephone Encounter (Signed)
Faxed printed/signed rx alprazolam to [facility] at [phone]. Received fax confirmation.

## 2017-12-08 NOTE — Telephone Encounter (Signed)
A prescription was written for alprazolam for the MRI.

## 2017-12-08 NOTE — Telephone Encounter (Signed)
The prescription for oxycodone was written on 05 December 2017.

## 2017-12-08 NOTE — Telephone Encounter (Signed)
R/C fmla paper work. Form on [employer]

## 2017-12-08 NOTE — Telephone Encounter (Signed)
Pt is asking that something be called in for her to help her handle the MRI due to her being claustrophobic  [id number] - [place], [place] [phone] (Phone) [phone] (Fax)

## 2017-12-08 NOTE — Telephone Encounter (Signed)
Pt picked up printed/signed rx today prior to office visit.

## 2017-12-09 LAB — CBC WITH DIFFERENTIAL/PLATELET
BASOS: 0 %
Basophils Absolute: 0 10*3/uL (ref 0.0–0.2)
EOS (ABSOLUTE): 0.3 10*3/uL (ref 0.0–0.4)
EOS: 4 %
HEMATOCRIT: 44.4 % (ref 34.0–46.6)
HEMOGLOBIN: 15 g/dL (ref 11.1–15.9)
IMMATURE GRANS (ABS): 0 10*3/uL (ref 0.0–0.1)
IMMATURE GRANULOCYTES: 0 %
LYMPHS: 20 %
Lymphocytes Absolute: 1.8 10*3/uL (ref 0.7–3.1)
MCH: 32.5 pg (ref 26.6–33.0)
MCHC: 33.8 g/dL (ref 31.5–35.7)
MCV: 96 fL (ref 79–97)
Monocytes Absolute: 0.7 10*3/uL (ref 0.1–0.9)
Monocytes: 7 %
NEUTROS ABS: 6.1 10*3/uL (ref 1.4–7.0)
Neutrophils: 69 %
PLATELETS: 367 10*3/uL (ref 150–379)
RBC: 4.62 x10E6/uL (ref 3.77–5.28)
RDW: 13.1 % (ref 12.3–15.4)
WBC: 8.9 10*3/uL (ref 3.4–10.8)

## 2017-12-09 LAB — COMPREHENSIVE METABOLIC PANEL
A/G RATIO: 2.1 (ref 1.2–2.2)
ALT: 20 IU/L (ref 0–32)
AST: 17 IU/L (ref 0–40)
Albumin: 4.4 g/dL (ref 3.5–5.5)
Alkaline Phosphatase: 79 IU/L (ref 39–117)
BUN/Creatinine Ratio: 16 (ref 9–23)
BUN: 12 mg/dL (ref 6–24)
Bilirubin Total: 0.3 mg/dL (ref 0.0–1.2)
CALCIUM: 9.6 mg/dL (ref 8.7–10.2)
CO2: 26 mmol/L (ref 20–29)
CREATININE: 0.77 mg/dL (ref 0.57–1.00)
Chloride: 103 mmol/L (ref 96–106)
GFR, EST AFRICAN AMERICAN: 106 mL/min/{1.73_m2} (ref >59)
GFR, EST NON AFRICAN AMERICAN: 92 mL/min/{1.73_m2} (ref >59)
GLOBULIN, TOTAL: 2.1 g/dL (ref 1.5–4.5)
Glucose: 114 mg/dL — ABNORMAL HIGH (ref 65–99)
Potassium: 5 mmol/L (ref 3.5–5.2)
SODIUM: 140 mmol/L (ref 134–144)
TOTAL PROTEIN: 6.5 g/dL (ref 6.0–8.5)

## 2017-12-09 NOTE — Telephone Encounter (Signed)
Gave completed/signed FMLA forms back to medical records to process for pt.  Dr. Jannifer Franklin filled out forms for husband. Gave him the following:  Up to 6 visits per year (1/2 day for each event) and for episodic flare ups (1 time per month, 3 days per episode).

## 2017-12-10 ENCOUNTER — Telehealth: Payer: Self-pay | Admitting: *Deleted

## 2017-12-10 NOTE — Telephone Encounter (Signed)
Called lvm, pt fmla form ready $50 fee is due.

## 2017-12-12 ENCOUNTER — Telehealth: Payer: Self-pay | Admitting: Neurology

## 2017-12-12 DIAGNOSIS — F1110 Opioid abuse, uncomplicated: Secondary | ICD-10-CM

## 2017-12-12 LAB — COMPLIANCE DRUG ANALYSIS, UR

## 2017-12-12 NOTE — Telephone Encounter (Signed)
I called the patient.  The blood work was unremarkable, urine drug screen was positive for oxycodone, but also positive for methamphetamine and cocaine.  The patient herself during the office visit indicated that a urine drug screen was needed, the patient claims that she does not do methamphetamine or cocaine, we will retest, metabolites of cocaine are long-acting, if drug screen is positive again, we will stop use of oxycodone.

## 2017-12-13 ENCOUNTER — Other Ambulatory Visit: Payer: Medicare Other

## 2017-12-13 ENCOUNTER — Other Ambulatory Visit: Payer: Self-pay | Admitting: Pediatrics

## 2017-12-13 ENCOUNTER — Inpatient Hospital Stay: Admission: RE | Admit: 2017-12-13 | Payer: Medicare Other | Source: Ambulatory Visit

## 2017-12-18 ENCOUNTER — Encounter: Payer: Self-pay | Admitting: Neurology

## 2017-12-31 ENCOUNTER — Other Ambulatory Visit: Payer: Self-pay | Admitting: Pediatrics

## 2018-01-27 ENCOUNTER — Other Ambulatory Visit: Payer: Self-pay | Admitting: Nurse Practitioner

## 2018-01-27 DIAGNOSIS — F3341 Major depressive disorder, recurrent, in partial remission: Secondary | ICD-10-CM

## 2018-02-13 ENCOUNTER — Telehealth: Payer: Self-pay | Admitting: Pediatrics

## 2018-02-16 NOTE — Telephone Encounter (Signed)
Message left to please call back if still needs advise or to be seen .

## 2018-02-17 ENCOUNTER — Telehealth: Payer: Self-pay | Admitting: Neurology

## 2018-02-17 DIAGNOSIS — IMO0001 Reserved for inherently not codable concepts without codable children: Secondary | ICD-10-CM

## 2018-02-17 NOTE — Telephone Encounter (Signed)
I called the patient.  The patient never saw the rheumatology doctor, we indicated on her last visit that we may need to stop the Ocrevus if another source of the arthralgias was not determined.  The patient will get another referral to the rheumatology physician for this purpose.  The patient indicates that her positive urine drug screen for cocaine and methamphetamine was related to the fact that her brother placed these drugs into her cigarettes and she smoked them without realizing that the drugs were in the cigarettes.  I will make another referral to rheumatology physician.

## 2018-02-18 ENCOUNTER — Other Ambulatory Visit: Payer: Self-pay | Admitting: Pediatrics

## 2018-02-18 NOTE — Telephone Encounter (Signed)
Last seen 09/11/17

## 2018-02-25 ENCOUNTER — Telehealth: Payer: Self-pay | Admitting: Neurology

## 2018-02-25 NOTE — Telephone Encounter (Signed)
I called the patient.  The patient will hold off on getting Ocrevus for now, we are still trying to get a rheumatology evaluation.  She wants something in [place] or [place].  I am not sure there are any rheumatologist in that area.  The patient will call me if she can find a rheumatologist that is closer.

## 2018-02-25 NOTE — Telephone Encounter (Signed)
Pt called wanting to see if she could be referred to a Rheumatology closer to [facility], also stating that she wasn't able to get an appt there till 4/30

## 2018-02-26 NOTE — Telephone Encounter (Signed)
I relayed information below to [facility]. They will put note in pt chart and hold off on ocrevus infusion until further notice. They are aware pt seeing Rheumatology first.

## 2018-04-21 ENCOUNTER — Telehealth: Payer: Self-pay | Admitting: Pediatrics

## 2018-04-21 NOTE — Telephone Encounter (Signed)
ntbs

## 2018-04-21 NOTE — Telephone Encounter (Signed)
What is the name of the medication? citalopram (CELEXA) 20 MG tablet  Have you contacted your pharmacy to request a refill? yes  Which pharmacy would you like this sent to? [facility] in [place]   Patient notified that their request is being sent to the clinical staff for review and that they should receive a call once it is complete. If they do not receive a call within 24 hours they can check with their pharmacy or our office.

## 2018-04-21 NOTE — Telephone Encounter (Signed)
Does pt ntbs for refills on celexa?

## 2018-04-22 NOTE — Telephone Encounter (Signed)
Left message- ntbs for med refill.

## 2018-04-27 ENCOUNTER — Other Ambulatory Visit: Payer: Self-pay | Admitting: Pediatrics

## 2018-04-28 NOTE — Telephone Encounter (Signed)
Last ov 09/12/17 last filled 02/18/18

## 2018-06-08 ENCOUNTER — Telehealth: Payer: Self-pay | Admitting: Neurology

## 2018-06-08 ENCOUNTER — Encounter: Payer: Self-pay | Admitting: Neurology

## 2018-06-08 ENCOUNTER — Ambulatory Visit: Payer: Medicare Other | Admitting: Neurology

## 2018-06-08 NOTE — Telephone Encounter (Signed)
This patient did not show for a revisit appointment today. 

## 2018-06-26 ENCOUNTER — Other Ambulatory Visit: Payer: Self-pay | Admitting: Family Medicine

## 2018-08-11 ENCOUNTER — Telehealth: Payer: Self-pay | Admitting: *Deleted

## 2018-08-11 NOTE — Telephone Encounter (Signed)
VM from [place] request to refill Meloxicam. [facility] aware refill denied & pt needs to contact office.   Message routed to pools to contact patient, needs to discuss with billing, then NTBS since last OV 09/12/17

## 2019-01-31 ENCOUNTER — Emergency Department (HOSPITAL_COMMUNITY): Payer: Medicare Other

## 2019-01-31 ENCOUNTER — Emergency Department (HOSPITAL_COMMUNITY)
Admission: EM | Admit: 2019-01-31 | Discharge: 2019-01-31 | Disposition: A | Discharge status: Home / Self Care | Payer: Medicare Other | Attending: Emergency Medicine | Admitting: Emergency Medicine

## 2019-01-31 ENCOUNTER — Encounter (HOSPITAL_COMMUNITY): Payer: Self-pay | Admitting: Emergency Medicine

## 2019-01-31 ENCOUNTER — Other Ambulatory Visit: Payer: Self-pay

## 2019-01-31 DIAGNOSIS — Z91040 Latex allergy status: Secondary | ICD-10-CM | POA: Insufficient documentation

## 2019-01-31 DIAGNOSIS — Z79899 Other long term (current) drug therapy: Secondary | ICD-10-CM | POA: Insufficient documentation

## 2019-01-31 DIAGNOSIS — R609 Edema, unspecified: Secondary | ICD-10-CM | POA: Diagnosis not present

## 2019-01-31 DIAGNOSIS — R600 Localized edema: Secondary | ICD-10-CM

## 2019-01-31 DIAGNOSIS — F17210 Nicotine dependence, cigarettes, uncomplicated: Secondary | ICD-10-CM | POA: Diagnosis not present

## 2019-01-31 DIAGNOSIS — R0602 Shortness of breath: Secondary | ICD-10-CM | POA: Diagnosis present

## 2019-01-31 LAB — BASIC METABOLIC PANEL
Anion gap: 8 (ref 5–15)
BUN: 19 mg/dL (ref 6–20)
CO2: 25 mmol/L (ref 22–32)
Calcium: 8.6 mg/dL — ABNORMAL LOW (ref 8.9–10.3)
Chloride: 105 mmol/L (ref 98–111)
Creatinine, Ser: 0.66 mg/dL (ref 0.44–1.00)
GFR calc Af Amer: >60 mL/min (ref >60)
GFR calc non Af Amer: >60 mL/min (ref >60)
Glucose, Bld: 105 mg/dL — ABNORMAL HIGH (ref 70–99)
Potassium: 4.2 mmol/L (ref 3.5–5.1)
Sodium: 138 mmol/L (ref 135–145)

## 2019-01-31 LAB — HEPATIC FUNCTION PANEL
ALT: 62 U/L — ABNORMAL HIGH (ref 0–44)
AST: 30 U/L (ref 15–41)
Albumin: 3.9 g/dL (ref 3.5–5.0)
Alkaline Phosphatase: 72 U/L (ref 38–126)
Bilirubin, Direct: 0.1 mg/dL (ref 0.0–0.2)
Indirect Bilirubin: 0.3 mg/dL (ref 0.3–0.9)
Total Bilirubin: 0.4 mg/dL (ref 0.3–1.2)
Total Protein: 6.9 g/dL (ref 6.5–8.1)

## 2019-01-31 LAB — DIFFERENTIAL
Basophils Absolute: 0 10*3/uL (ref 0.0–0.1)
Basophils Relative: 0 %
EOS ABS: 0.4 10*3/uL (ref 0.0–0.5)
Eosinophils Relative: 3 %
LYMPHS ABS: 1.9 10*3/uL (ref 0.7–4.0)
Lymphocytes Relative: 18 %
Monocytes Absolute: 0.9 10*3/uL (ref 0.1–1.0)
Monocytes Relative: 8 %
Neutro Abs: 7.2 10*3/uL (ref 1.7–7.7)
Neutrophils Relative %: 69 %

## 2019-01-31 LAB — CBC
HCT: 42.1 % (ref 36.0–46.0)
Hemoglobin: 13.5 g/dL (ref 12.0–15.0)
MCH: 30.5 pg (ref 26.0–34.0)
MCHC: 32.1 g/dL (ref 30.0–36.0)
MCV: 95.2 fL (ref 80.0–100.0)
Platelets: 280 10*3/uL (ref 150–400)
RBC: 4.42 MIL/uL (ref 3.87–5.11)
RDW: 12.3 % (ref 11.5–15.5)
WBC: 10.2 10*3/uL (ref 4.0–10.5)
nRBC: 0 % (ref 0.0–0.2)

## 2019-01-31 LAB — BRAIN NATRIURETIC PEPTIDE: B Natriuretic Peptide: 23 pg/mL (ref 0.0–100.0)

## 2019-01-31 LAB — I-STAT TROPONIN, ED: Troponin i, poc: 0 ng/mL (ref 0.00–0.08)

## 2019-01-31 MED ORDER — FUROSEMIDE 10 MG/ML IJ SOLN
20.0000 mg | Freq: Once | INTRAMUSCULAR | Status: AC
  Administered 2019-01-31: 20 mg via INTRAVENOUS
  Filled 2019-01-31: qty 2

## 2019-01-31 MED ORDER — SODIUM CHLORIDE 0.9% FLUSH: 3.0000 mL | Freq: Once | INTRAVENOUS | Status: DC

## 2019-01-31 MED ORDER — FUROSEMIDE 20 MG PO TABS: 20.0000 mg | ORAL_TABLET | Freq: Every day | ORAL | 0 refills | Status: AC

## 2019-01-31 NOTE — ED Triage Notes (Signed)
Has gained 27 lds this week  Diagnosed with endocarditis at urgent in [place] told to go immediately to ED so pt waited Until she was "mentally ready"   Went to [place] ED "they weren't moving" so cma ehere today for eval

## 2019-01-31 NOTE — Discharge Instructions (Signed)
Follow-up with Dr. Bronson Ing or 1 of his partners here at the cardiology office.  And follow-up with a family doctor

## 2019-01-31 NOTE — ED Notes (Signed)
Pt transported to Xray. 

## 2019-01-31 NOTE — ED Provider Notes (Signed)
[facility] EMERGENCY DEPARTMENT Provider Note   CSN: [id number] Arrival date & time: 01/31/19  1258     History   Chief Complaint Chief Complaint  Patient presents with  . Shortness of Breath    dx endocarditis    HPI Alyssa Washington is a 48 y.o. female.  Patient complains of swelling to her legs for months.  The history is provided by the patient. No language interpreter was used.  Shortness of Breath  Severity:  Moderate Onset quality:  Gradual Timing:  Constant Progression:  Waxing and waning Chronicity:  New Context: not activity   Relieved by:  Nothing Worsened by:  Nothing Ineffective treatments:  None tried Associated symptoms: no abdominal pain, no chest pain, no cough, no headaches and no rash     Past Medical History:  Diagnosis Date  . Abnormality of gait 10/28/2016  . Anxiety   . Arthritis   . Chronic fatigue 10/28/2016  . Depression   . Fibromyalgia   . 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 GERD (gastroesophageal reflux disease)   . Kidney stones   . Migraine   . MS (multiple sclerosis) ([place])   . Neuromuscular disorder ([place])   . Ulcerative colitis [facility])     Patient Active Problem List   Diagnosis Date Noted  . Abnormality of gait 10/28/2016  . Chronic fatigue 10/28/2016  . 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 Chronic insomnia 10/28/2016  . Multiple sclerosis ([place]) 10/10/2016  . Crohn disease ([place]) 10/10/2016  . Depression 10/10/2016  . GERD (gastroesophageal reflux disease) 10/10/2016  . Hyperlipidemia 10/10/2016  . Uterine leiomyoma 10/10/2016  . Panic attack 10/10/2016    Past Surgical History:  Procedure Laterality Date  . BACK SURGERY  1993  . BUNIONECTOMY  1997  . [place], 2004  . CHOLECYSTECTOMY    . HARDWARE REMOVAL  2017  . HERNIA REPAIR    . INNER EAR SURGERY Right 1981  . LUMBAR FUSION  2012  . SUBTOTAL COLECTOMY  2007     OB History   No obstetric history on file.      Home Medications    Prior  to Admission medications   Medication Sig Start Date End Date Taking? Authorizing Provider  citalopram (CELEXA) 40 MG tablet Take 40 mg by mouth daily.   Yes [provider]  meloxicam (MOBIC) 15 MG tablet Take 1 tablet (15 mg total) by mouth daily. 04/28/18  Yes Claretta Fraise, MD  omeprazole (PRILOSEC) 40 MG capsule take 1 capsule once daily 12/17/17  Yes Eustaquio Maize, MD  furosemide (LASIX) 20 MG tablet Take 1 tablet (20 mg total) by mouth daily. 01/31/19   Milton Ferguson, MD    Family History Family History  Problem Relation Age of Onset  . Arthritis Mother   . Cancer Mother   . Cancer Father   . COPD Father   . Depression Father   . Hyperlipidemia Sister   . Alcohol abuse Brother   . Drug abuse Brother   . Heart disease Maternal Uncle   . Diabetes Maternal Uncle   . Early death Maternal Uncle   . Depression Paternal Aunt   . Depression Paternal Uncle   . Heart disease Maternal Grandmother   . Early death Maternal Grandmother   . Cancer Paternal Grandmother   . Depression Paternal Grandmother   . Alcohol abuse Paternal Grandfather   . Depression Paternal Grandfather     Social History Social History   Tobacco  Use  . Smoking status: Current Every Day Smoker    Packs/day: 0.50    Types: Cigarettes  . Smokeless tobacco: Never Used  Substance Use Topics  . Alcohol use: Yes    Comment: Occasional  . Drug use: No     Allergies   Dilaudid [hydromorphone hcl]; Amoxicillin; Azithromycin; Benadryl [diphenhydramine hcl]; Charcoal; Codeine; Erythromycin; Flomax [tamsulosin hcl]; Nortriptyline; Penicillins; Prednisone; Vicodin [hydrocodone-acetaminophen]; and Latex   Review of Systems Review of Systems  Constitutional: Negative for appetite change and fatigue.  HENT: Negative for congestion, ear discharge and sinus pressure.   Eyes: Negative for discharge.  Respiratory: Positive for shortness of breath. Negative for cough.   Cardiovascular: Negative for chest  pain.  Gastrointestinal: Negative for abdominal pain and diarrhea.  Genitourinary: Negative for frequency and hematuria.  Musculoskeletal: Negative for back pain.       Edema in her legs  Skin: Negative for rash.  Neurological: Negative for seizures and headaches.  Psychiatric/Behavioral: Negative for hallucinations.     Physical Exam Updated Vital Signs BP 140/70   Pulse 84   Temp 97.8 F (36.6 C) (Oral)   Resp (!) 21   Ht 5\' 4"  (1.626 m)   Wt 90.7 kg   SpO2 99%   BMI 34.33 kg/m   Physical Exam Vitals signs and nursing note reviewed.  Constitutional:      Appearance: She is well-developed.  HENT:     Head: Normocephalic.     Nose: Nose normal.  Eyes:     General: No scleral icterus.    Conjunctiva/sclera: Conjunctivae normal.  Neck:     Musculoskeletal: Neck supple.     Thyroid: No thyromegaly.  Cardiovascular:     Rate and Rhythm: Normal rate and regular rhythm.     Heart sounds: No murmur. No friction rub. No gallop.   Pulmonary:     Breath sounds: No stridor. No wheezing or rales.  Chest:     Chest wall: No tenderness.  Abdominal:     General: There is no distension.     Tenderness: There is no abdominal tenderness. There is no rebound.  Musculoskeletal: Normal range of motion.     Comments: 3+ edema all the way up to her knees bilateral  Lymphadenopathy:     Cervical: No cervical adenopathy.  Skin:    Findings: No erythema or rash.  Neurological:     Mental Status: She is oriented to person, place, and time.     Motor: No abnormal muscle tone.     Coordination: Coordination normal.  Psychiatric:        Behavior: Behavior normal.      ED Treatments / Results  Labs (all labs ordered are listed, but only abnormal results are displayed) Labs Reviewed  BASIC METABOLIC PANEL - Abnormal; Notable for the following components:      Result Value   Glucose, Bld 105 (*)    Calcium 8.6 (*)    All other components within normal limits  HEPATIC FUNCTION  PANEL - Abnormal; Notable for the following components:   ALT 62 (*)    All other components within normal limits  CBC  BRAIN NATRIURETIC PEPTIDE  DIFFERENTIAL  I-STAT TROPONIN, ED    EKG EKG Interpretation  Date/Time:  Sunday January 31 2019 13:19:47 EST Ventricular Rate:  83 PR Interval:    QRS Duration: 74 QT Interval:  358 QTC Calculation: 421 R Axis:   95 Text Interpretation:  Sinus rhythm Biatrial enlargement Borderline right axis deviation  Baseline wander in lead(s) I III aVL aVF Confirmed by Milton Ferguson [phone]) on 01/31/2019 3:06:42 PM   Radiology Dg Chest 2 View  Result Date: 01/31/2019 CLINICAL DATA:  48 year old female was recently diagnosed with endocarditis. EXAM: CHEST - 2 VIEW COMPARISON:  CT Abdomen and Pelvis 04/26/2017. FINDINGS: Normal lung volumes and mediastinal contours. Visualized tracheal air column is within normal limits. Both lungs appear clear. No pneumothorax or pleural effusion. Osteopenia for age. No acute osseous abnormality identified. Negative visible bowel gas pattern. IMPRESSION: No radiographic cardiopulmonary abnormality. Electronically Signed   By: Genevie Ann M.D.   On: 01/31/2019 14:11    Procedures Procedures (including critical care time)  Medications Ordered in ED Medications  sodium chloride flush (NS) 0.9 % injection 3 mL (3 mLs Intravenous Not Given 01/31/19 1334)  furosemide (LASIX) injection 20 mg (has no administration in time range)     Initial Impression / Assessment and Plan / ED Course  I have reviewed the triage vital signs and the nursing notes.  Pertinent labs & imaging results that were available during my care of the patient were reviewed by me and considered in my medical decision making (see chart for details).     Labs unremarkable.  We will place the patient on Lasix 20 mg a day for the edema in her legs and refer her to cardiology.  She is also told to follow-up with primary care doctor  Final Clinical  Impressions(s) / ED Diagnoses   Final diagnoses:  Bilateral leg edema    ED Discharge Orders         Ordered    furosemide (LASIX) 20 MG tablet  Daily     01/31/19 1518           Milton Ferguson, MD 01/31/19 [phone]

## 2019-08-02 ENCOUNTER — Telehealth: Payer: Self-pay | Admitting: General Practice

## 2019-08-02 NOTE — Telephone Encounter (Signed)
Records was sent to dr Evette Doffing falgui

## 2021-01-06 DIAGNOSIS — G35 Multiple sclerosis: Secondary | ICD-10-CM | POA: Diagnosis not present

## 2021-01-06 DIAGNOSIS — Z886 Allergy status to analgesic agent status: Secondary | ICD-10-CM | POA: Diagnosis not present

## 2021-01-06 DIAGNOSIS — Z888 Allergy status to other drugs, medicaments and biological substances status: Secondary | ICD-10-CM | POA: Diagnosis not present

## 2021-01-06 DIAGNOSIS — I10 Essential (primary) hypertension: Secondary | ICD-10-CM | POA: Diagnosis not present

## 2021-01-06 DIAGNOSIS — Z91040 Latex allergy status: Secondary | ICD-10-CM | POA: Diagnosis not present

## 2021-01-06 DIAGNOSIS — U071 COVID-19: Secondary | ICD-10-CM | POA: Diagnosis not present

## 2021-02-08 DIAGNOSIS — Z1329 Encounter for screening for other suspected endocrine disorder: Secondary | ICD-10-CM | POA: Diagnosis not present

## 2021-02-08 DIAGNOSIS — K2100 Gastro-esophageal reflux disease with esophagitis, without bleeding: Secondary | ICD-10-CM | POA: Diagnosis not present

## 2021-02-08 DIAGNOSIS — I10 Essential (primary) hypertension: Secondary | ICD-10-CM | POA: Diagnosis not present

## 2021-02-08 DIAGNOSIS — F17210 Nicotine dependence, cigarettes, uncomplicated: Secondary | ICD-10-CM | POA: Diagnosis not present

## 2021-02-20 DIAGNOSIS — K2100 Gastro-esophageal reflux disease with esophagitis, without bleeding: Secondary | ICD-10-CM | POA: Diagnosis not present

## 2021-02-20 DIAGNOSIS — M797 Fibromyalgia: Secondary | ICD-10-CM | POA: Diagnosis not present

## 2021-02-20 DIAGNOSIS — G35 Multiple sclerosis: Secondary | ICD-10-CM | POA: Diagnosis not present

## 2021-02-20 DIAGNOSIS — I10 Essential (primary) hypertension: Secondary | ICD-10-CM | POA: Diagnosis not present

## 2021-02-20 DIAGNOSIS — E6609 Other obesity due to excess calories: Secondary | ICD-10-CM | POA: Diagnosis not present

## 2021-02-20 DIAGNOSIS — E559 Vitamin D deficiency, unspecified: Secondary | ICD-10-CM | POA: Diagnosis not present

## 2021-02-20 DIAGNOSIS — F331 Major depressive disorder, recurrent, moderate: Secondary | ICD-10-CM | POA: Diagnosis not present

## 2021-02-20 DIAGNOSIS — F17210 Nicotine dependence, cigarettes, uncomplicated: Secondary | ICD-10-CM | POA: Diagnosis not present

## 7523-08-24 DEATH — deceased
